# Patient Record
Sex: Male | Born: 1960 | Race: Black or African American | Hispanic: No | Smoking: Never smoker
Health system: Southern US, Community
[De-identification: ages and names within clinical notes are randomized; demographics above are authoritative.]

## PROBLEM LIST (undated history)

## (undated) DIAGNOSIS — H409 Unspecified glaucoma: Secondary | ICD-10-CM

## (undated) DIAGNOSIS — I219 Acute myocardial infarction, unspecified: Secondary | ICD-10-CM

## (undated) DIAGNOSIS — I2510 Atherosclerotic heart disease of native coronary artery without angina pectoris: Secondary | ICD-10-CM

## (undated) DIAGNOSIS — F329 Major depressive disorder, single episode, unspecified: Secondary | ICD-10-CM

## (undated) DIAGNOSIS — E119 Type 2 diabetes mellitus without complications: Secondary | ICD-10-CM

## (undated) DIAGNOSIS — I10 Essential (primary) hypertension: Secondary | ICD-10-CM

## (undated) DIAGNOSIS — F419 Anxiety disorder, unspecified: Secondary | ICD-10-CM

## (undated) DIAGNOSIS — F32A Depression, unspecified: Secondary | ICD-10-CM

## (undated) HISTORY — PX: OTHER SURGICAL HISTORY: SHX169

## (undated) HISTORY — PX: EYE SURGERY: SHX253

---

## 2016-11-01 ENCOUNTER — Inpatient Hospital Stay (HOSPITAL_COMMUNITY)
Admission: EM | Admit: 2016-11-01 | Discharge: 2016-11-03 | DRG: 247 | Disposition: A | Discharge status: Home / Self Care | Payer: Medicare Other | Attending: Cardiology | Admitting: Cardiology

## 2016-11-01 ENCOUNTER — Encounter (HOSPITAL_COMMUNITY): Payer: Self-pay

## 2016-11-01 ENCOUNTER — Emergency Department (HOSPITAL_COMMUNITY): Payer: Medicare Other

## 2016-11-01 DIAGNOSIS — Z6841 Body Mass Index (BMI) 40.0 and over, adult: Secondary | ICD-10-CM

## 2016-11-01 DIAGNOSIS — Z79899 Other long term (current) drug therapy: Secondary | ICD-10-CM | POA: Diagnosis not present

## 2016-11-01 DIAGNOSIS — G4733 Obstructive sleep apnea (adult) (pediatric): Secondary | ICD-10-CM | POA: Diagnosis present

## 2016-11-01 DIAGNOSIS — I214 Non-ST elevation (NSTEMI) myocardial infarction: Secondary | ICD-10-CM | POA: Diagnosis not present

## 2016-11-01 DIAGNOSIS — F329 Major depressive disorder, single episode, unspecified: Secondary | ICD-10-CM | POA: Diagnosis present

## 2016-11-01 DIAGNOSIS — Z7982 Long term (current) use of aspirin: Secondary | ICD-10-CM | POA: Diagnosis not present

## 2016-11-01 DIAGNOSIS — Z955 Presence of coronary angioplasty implant and graft: Secondary | ICD-10-CM

## 2016-11-01 DIAGNOSIS — I10 Essential (primary) hypertension: Secondary | ICD-10-CM | POA: Diagnosis not present

## 2016-11-01 DIAGNOSIS — I252 Old myocardial infarction: Secondary | ICD-10-CM

## 2016-11-01 DIAGNOSIS — Z7984 Long term (current) use of oral hypoglycemic drugs: Secondary | ICD-10-CM | POA: Diagnosis not present

## 2016-11-01 DIAGNOSIS — E1165 Type 2 diabetes mellitus with hyperglycemia: Secondary | ICD-10-CM | POA: Diagnosis present

## 2016-11-01 DIAGNOSIS — Z7902 Long term (current) use of antithrombotics/antiplatelets: Secondary | ICD-10-CM

## 2016-11-01 DIAGNOSIS — Z880 Allergy status to penicillin: Secondary | ICD-10-CM

## 2016-11-01 DIAGNOSIS — E785 Hyperlipidemia, unspecified: Secondary | ICD-10-CM | POA: Diagnosis not present

## 2016-11-01 DIAGNOSIS — F419 Anxiety disorder, unspecified: Secondary | ICD-10-CM | POA: Diagnosis present

## 2016-11-01 DIAGNOSIS — I219 Acute myocardial infarction, unspecified: Secondary | ICD-10-CM | POA: Diagnosis not present

## 2016-11-01 DIAGNOSIS — I2510 Atherosclerotic heart disease of native coronary artery without angina pectoris: Secondary | ICD-10-CM | POA: Diagnosis present

## 2016-11-01 DIAGNOSIS — E118 Type 2 diabetes mellitus with unspecified complications: Secondary | ICD-10-CM

## 2016-11-01 HISTORY — DX: Essential (primary) hypertension: I10

## 2016-11-01 HISTORY — DX: Major depressive disorder, single episode, unspecified: F32.9

## 2016-11-01 HISTORY — DX: Unspecified glaucoma: H40.9

## 2016-11-01 HISTORY — DX: Atherosclerotic heart disease of native coronary artery without angina pectoris: I25.10

## 2016-11-01 HISTORY — DX: Type 2 diabetes mellitus without complications: E11.9

## 2016-11-01 HISTORY — DX: Depression, unspecified: F32.A

## 2016-11-01 HISTORY — DX: Acute myocardial infarction, unspecified: I21.9

## 2016-11-01 HISTORY — DX: Anxiety disorder, unspecified: F41.9

## 2016-11-01 LAB — PROTIME-INR
INR: 1
Prothrombin Time: 13.2 seconds (ref 11.4–15.2)

## 2016-11-01 LAB — BASIC METABOLIC PANEL
Anion gap: 6 (ref 5–15)
BUN: 15 mg/dL (ref 6–20)
CO2: 25 mmol/L (ref 22–32)
Calcium: 9.1 mg/dL (ref 8.9–10.3)
Chloride: 107 mmol/L (ref 101–111)
Creatinine, Ser: 1.05 mg/dL (ref 0.61–1.24)
GFR calc Af Amer: >60 mL/min (ref >60)
GLUCOSE: 165 mg/dL — AB (ref 65–99)
POTASSIUM: 3.9 mmol/L (ref 3.5–5.1)
Sodium: 138 mmol/L (ref 135–145)

## 2016-11-01 LAB — I-STAT TROPONIN, ED: Troponin i, poc: 0.36 ng/mL (ref 0.00–0.08)

## 2016-11-01 LAB — CBC
HEMATOCRIT: 37.7 % — AB (ref 39.0–52.0)
HEMOGLOBIN: 12.7 g/dL — AB (ref 13.0–17.0)
MCH: 26.5 pg (ref 26.0–34.0)
MCHC: 33.7 g/dL (ref 30.0–36.0)
MCV: 78.7 fL (ref 78.0–100.0)
Platelets: 216 10*3/uL (ref 150–400)
RBC: 4.79 MIL/uL (ref 4.22–5.81)
RDW: 15.6 % — ABNORMAL HIGH (ref 11.5–15.5)
WBC: 6.3 10*3/uL (ref 4.0–10.5)

## 2016-11-01 LAB — APTT
APTT: 25 s (ref 24–36)
aPTT: 37 seconds — ABNORMAL HIGH (ref 24–36)

## 2016-11-01 MED ORDER — HEPARIN BOLUS VIA INFUSION
4000.0000 [IU] | Freq: Once | INTRAVENOUS | Status: AC
  Administered 2016-11-01: 4000 [IU] via INTRAVENOUS
  Filled 2016-11-01: qty 4000

## 2016-11-01 MED ORDER — ASPIRIN 81 MG PO CHEW: 81.0000 mg | CHEWABLE_TABLET | Freq: Every day | ORAL | Status: DC

## 2016-11-01 MED ORDER — CLOPIDOGREL BISULFATE 75 MG PO TABS
75.0000 mg | ORAL_TABLET | Freq: Once | ORAL | Status: DC
  Filled 2016-11-01: qty 1

## 2016-11-01 MED ORDER — HEPARIN (PORCINE) IN NACL 100-0.45 UNIT/ML-% IJ SOLN
1500.0000 [IU]/h | INTRAMUSCULAR | Status: DC
  Administered 2016-11-01: 1300 [IU]/h via INTRAVENOUS
  Administered 2016-11-02: 1500 [IU]/h via INTRAVENOUS
  Filled 2016-11-01 (×2): qty 250

## 2016-11-01 MED ORDER — SENNOSIDES-DOCUSATE SODIUM 8.6-50 MG PO TABS
1.0000 | ORAL_TABLET | Freq: Two times a day (BID) | ORAL | Status: DC
  Administered 2016-11-01 – 2016-11-02 (×3): 1 via ORAL
  Filled 2016-11-01 (×3): qty 1

## 2016-11-01 MED ORDER — ASPIRIN 81 MG PO CHEW
324.0000 mg | CHEWABLE_TABLET | ORAL | Status: AC
  Administered 2016-11-01: 324 mg via ORAL
  Filled 2016-11-01: qty 4

## 2016-11-01 MED ORDER — ONDANSETRON HCL 4 MG/2ML IJ SOLN
4.0000 mg | Freq: Four times a day (QID) | INTRAMUSCULAR | Status: DC | PRN
  Administered 2016-11-02: 4 mg via INTRAVENOUS
  Filled 2016-11-01: qty 2

## 2016-11-01 MED ORDER — NITROGLYCERIN IN D5W 200-5 MCG/ML-% IV SOLN
10.0000 ug/min | INTRAVENOUS | Status: DC
  Administered 2016-11-01: 10 ug/min via INTRAVENOUS
  Administered 2016-11-02: 23.333 ug/min via INTRAVENOUS
  Administered 2016-11-02: 50 ug/min via INTRAVENOUS
  Filled 2016-11-01 (×2): qty 250

## 2016-11-01 MED ORDER — PANTOPRAZOLE SODIUM 40 MG PO TBEC
40.0000 mg | DELAYED_RELEASE_TABLET | Freq: Two times a day (BID) | ORAL | Status: DC
  Administered 2016-11-01 – 2016-11-03 (×4): 40 mg via ORAL
  Filled 2016-11-01 (×4): qty 1

## 2016-11-01 MED ORDER — ASPIRIN EC 81 MG PO TBEC
81.0000 mg | DELAYED_RELEASE_TABLET | Freq: Every day | ORAL | Status: DC
  Administered 2016-11-02 – 2016-11-03 (×2): 81 mg via ORAL
  Filled 2016-11-01 (×2): qty 1

## 2016-11-01 MED ORDER — FUROSEMIDE 40 MG PO TABS
40.0000 mg | ORAL_TABLET | Freq: Two times a day (BID) | ORAL | Status: DC
  Administered 2016-11-02 – 2016-11-03 (×2): 40 mg via ORAL
  Filled 2016-11-01 (×2): qty 1

## 2016-11-01 MED ORDER — CARVEDILOL 12.5 MG PO TABS
12.5000 mg | ORAL_TABLET | Freq: Two times a day (BID) | ORAL | Status: DC
  Administered 2016-11-01 – 2016-11-03 (×3): 12.5 mg via ORAL
  Filled 2016-11-01 (×3): qty 1

## 2016-11-01 MED ORDER — CLOPIDOGREL BISULFATE 75 MG PO TABS
75.0000 mg | ORAL_TABLET | Freq: Every day | ORAL | Status: DC
  Administered 2016-11-02: 75 mg via ORAL
  Filled 2016-11-01: qty 1

## 2016-11-01 MED ORDER — ATORVASTATIN CALCIUM 80 MG PO TABS
80.0000 mg | ORAL_TABLET | Freq: Every day | ORAL | Status: DC
  Administered 2016-11-01 – 2016-11-02 (×2): 80 mg via ORAL
  Filled 2016-11-01 (×2): qty 1

## 2016-11-01 MED ORDER — LISINOPRIL 20 MG PO TABS
20.0000 mg | ORAL_TABLET | Freq: Two times a day (BID) | ORAL | Status: DC
  Administered 2016-11-01 – 2016-11-03 (×4): 20 mg via ORAL
  Filled 2016-11-01 (×4): qty 1

## 2016-11-01 MED ORDER — TRAMADOL HCL 50 MG PO TABS
50.0000 mg | ORAL_TABLET | Freq: Three times a day (TID) | ORAL | Status: DC | PRN
  Administered 2016-11-01 – 2016-11-02 (×2): 50 mg via ORAL
  Filled 2016-11-01 (×2): qty 1

## 2016-11-01 MED ORDER — ACETAMINOPHEN 325 MG PO TABS
650.0000 mg | ORAL_TABLET | ORAL | Status: DC | PRN
  Administered 2016-11-02 – 2016-11-03 (×2): 650 mg via ORAL
  Filled 2016-11-01 (×2): qty 2

## 2016-11-01 MED ORDER — AMLODIPINE BESYLATE 2.5 MG PO TABS
2.5000 mg | ORAL_TABLET | Freq: Two times a day (BID) | ORAL | Status: DC
  Administered 2016-11-01 – 2016-11-03 (×4): 2.5 mg via ORAL
  Filled 2016-11-01 (×4): qty 1

## 2016-11-01 MED ORDER — NITROGLYCERIN 0.4 MG SL SUBL: 0.4000 mg | SUBLINGUAL_TABLET | SUBLINGUAL | Status: DC | PRN

## 2016-11-01 MED ORDER — MAGNESIUM OXIDE 420 MG PO TABS: 840.0000 mg | ORAL_TABLET | Freq: Every day | ORAL | Status: DC

## 2016-11-01 MED ORDER — ASPIRIN 300 MG RE SUPP: 300.0000 mg | RECTAL | Status: AC

## 2016-11-01 MED ORDER — BISACODYL 5 MG PO TBEC
10.0000 mg | DELAYED_RELEASE_TABLET | Freq: Two times a day (BID) | ORAL | Status: DC | PRN
  Administered 2016-11-03: 10 mg via ORAL
  Filled 2016-11-01: qty 2

## 2016-11-01 MED ORDER — POTASSIUM CHLORIDE CRYS ER 10 MEQ PO TBCR
10.0000 meq | EXTENDED_RELEASE_TABLET | Freq: Every day | ORAL | Status: DC
  Administered 2016-11-02 – 2016-11-03 (×2): 10 meq via ORAL
  Filled 2016-11-01 (×2): qty 1

## 2016-11-01 NOTE — Progress Notes (Signed)
ANTICOAGULATION CONSULT NOTE - Initial Consult  Pharmacy Consult for IV heparin Indication: chest pain/ACS  Allergies  Allergen Reactions  . Penicillins Hives and Other (See Comments)    Has patient had a PCN reaction causing immediate rash, facial/tongue/throat swelling, SOB or lightheadedness with hypotension: No Has patient had a PCN reaction causing severe rash involving mucus membranes or skin necrosis: No Has patient had a PCN reaction that required hospitalization No Has patient had a PCN reaction occurring within the last 10 years: No If all of the above answers are "NO", then may proceed with Cephalosporin use.    Patient Measurements: Height: 5' 9.5" (176.5 cm) Weight: 278 lb (126.1 kg) IBW/kg (Calculated) : 71.85 Heparin Dosing Weight: 100.7 kg  Vital Signs: Temp: 97.6 F (36.4 C) (02/12 1657) Temp Source: Oral (02/12 1657) BP: 143/82 (02/12 1930) Pulse Rate: 84 (02/12 1930)  Labs:  Recent Labs  11/01/16 1738 11/01/16 1739  HGB  --  12.7*  HCT  --  37.7*  PLT  --  216  APTT 25  --   LABPROT 13.2  --   INR 1.00  --   CREATININE  --  1.05    Estimated Creatinine Clearance: 107.7 mL/min (by C-G formula based on SCr of 1.05 mg/dL).   Medical History: Past Medical History:  Diagnosis Date  . Anxiety   . Coronary artery disease   . Depression   . Diabetes mellitus without complication (HCC)   . Glaucoma   . Hypertension   . Myocardial infarct      Assessment: 42 y/oM with PMH of CAD, DM, HTN, MI, anxiety, depression who presents to [facility] ED on 11/01/2016 with left upper arm and chest pain x 2 days. Troponin elevated in ED. Pharmacy consulted to start IV heparin infusion. Patient on DAPT (ASA/Plavix) PTA, but not on any anticoagulants PTA. Baseline aPTT, PT/INR WNL.  Goal of Therapy:  Heparin level 0.3-0.7 units/ml Monitor platelets by anticoagulation protocol: Yes   Plan:   Heparin 4000 units IV bolus, then start heparin infusion at 1300  units/hr.  Heparin level 6 hours after start of infusion.  Daily CBC and heparin level while on heparin infusion.  Monitor closely for s/sx of bleeding.   Greer Pickerel, PharmD, BCPS Pager: [phone] 11/01/2016 7:47 PM

## 2016-11-01 NOTE — H&P (Signed)
CARDIOLOGY H&P  HPI: 3M w/ history of CAD (s/p multiple MI's and multiple stents), HTN, HLD, DM p/w NSTEMI.   Patient visiting from out of town [facility]) due to a death in his family (his youngest daughter passed away). Erasmo Score was yesterday. Yesterday afternoon began experiencing left sided chest pain and pressure radiating into the neck and the left arm. Same pain that he has experienced in the past during his multiple prior MI's. Pain came on at rest and stuttered on and off for the remainder of the day and overnight. Patient's other daughter brought him to the hospital today for evaluation. Patient has now been chest pain free for >6 hours.   Patient notes that he has no history of volume retention or heart failure. He recalls having an echo sometime in the past few months with an LVEF of 60%. He had a coronary angiography in January 2018 (via radial approach) and does not believe any new stents were placed at that time. He takes aspirin and plavix every day. He denies any illicit drug use or alcohol use. He does not smoke cigarettes.   At OSH, patient was found to have an elevated troponin to 0.3. He was started on a nitro gtt and heparin gtt and transferred to [facility] for consideration of angiography.   Review of Systems:     Cardiac Review of Systems: {Y] = yes [ ]  = no  Chest Pain [   ]  Resting SOB [   ] Exertional SOB  [  ]  Orthopnea [  ]   Pedal Edema [   ]    Palpitations [  ] Syncope  [  ]   Presyncope [   ]  General Review of Systems: [Y] = yes [  ]=no Constitional: recent weight change [  ]; anorexia [  ]; fatigue [  ]; nausea [  ]; night sweats [  ]; fever [  ]; or chills [  ];                                                                     Dental: poor dentition[  ];   Eye : blurred vision [  ]; diplopia [   ]; vision changes [  ];  Amaurosis fugax[  ]; Resp: cough [  ];  wheezing[  ];  hemoptysis[  ]; shortness of breath[  ]; paroxysmal nocturnal dyspnea[  ]; dyspnea on  exertion[  ]; or orthopnea[  ];  GI:  gallstones[  ], vomiting[  ];  dysphagia[  ]; melena[  ];  hematochezia [  ]; heartburn[  ];   GU: kidney stones [  ]; hematuria[  ];   dysuria [  ];  nocturia[  ];               Skin: rash [  ], swelling[  ];, hair loss[  ];  peripheral edema[  ];  or itching[  ]; Musculosketetal: myalgias[  ];  joint swelling[  ];  joint erythema[  ];  joint pain[  ];  back pain[  ];  Heme/Lymph: bruising[  ];  bleeding[  ];  anemia[  ];  Neuro: TIA[  ];  headaches[  ];  stroke[  ];  vertigo[  ];  seizures[  ];   paresthesias[  ];  difficulty walking[  ];  Psych:depression[  ]; anxiety[  ];  Endocrine: diabetes[  ];  thyroid dysfunction[  ];  Other:  Past Medical History:  Diagnosis Date  . Anxiety   . Coronary artery disease   . Depression   . Diabetes mellitus without complication (HCC)   . Glaucoma   . Hypertension   . Myocardial infarct     Prior to Admission medications   Medication Sig Start Date End Date Taking? Authorizing Provider  amLODipine (NORVASC) 5 MG tablet Take 2.5 mg by mouth 2 (two) times daily.   Yes Historical Provider, MD  aspirin 81 MG chewable tablet Chew 81 mg by mouth daily.   Yes Historical Provider, MD  atorvastatin (LIPITOR) 80 MG tablet Take 80 mg by mouth at bedtime.   Yes Historical Provider, MD  bisacodyl (DULCOLAX) 5 MG EC tablet Take 10 mg by mouth 2 (two) times daily as needed for moderate constipation.   Yes Historical Provider, MD  carvedilol (COREG) 25 MG tablet Take 12.5 mg by mouth 2 (two) times daily with a meal.   Yes Historical Provider, MD  clopidogrel (PLAVIX) 75 MG tablet Take 75 mg by mouth daily.   Yes Historical Provider, MD  furosemide (LASIX) 40 MG tablet Take 40 mg by mouth 2 (two) times daily.   Yes Historical Provider, MD  lisinopril (PRINIVIL,ZESTRIL) 20 MG tablet Take 20 mg by mouth 2 (two) times daily.   Yes Historical Provider, MD  Magnesium Oxide 420 MG TABS Take 840 mg by mouth daily.   Yes Historical  Provider, MD  metFORMIN (GLUCOPHAGE-XR) 500 MG 24 hr tablet Take 500 mg by mouth daily with breakfast.   Yes Historical Provider, MD  pantoprazole (PROTONIX) 40 MG tablet Take 40 mg by mouth 2 (two) times daily.   Yes Historical Provider, MD  potassium chloride (K-DUR,KLOR-CON) 10 MEQ tablet Take 10 mEq by mouth daily.   Yes Historical Provider, MD  senna-docusate (SENOKOT-S) 8.6-50 MG tablet Take 1 tablet by mouth 2 (two) times daily.   Yes Historical Provider, MD  traMADol (ULTRAM) 50 MG tablet Take 50 mg by mouth 3 (three) times daily as needed for moderate pain.   Yes Historical Provider, MD    Allergies  Allergen Reactions  . Penicillins Hives and Other (See Comments)    Has patient had a PCN reaction causing immediate rash, facial/tongue/throat swelling, SOB or lightheadedness with hypotension: No Has patient had a PCN reaction causing severe rash involving mucus membranes or skin necrosis: No Has patient had a PCN reaction that required hospitalization No Has patient had a PCN reaction occurring within the last 10 years: No If all of the above answers are "NO", then may proceed with Cephalosporin use.    Social History   Social History  . Marital status: Divorced    Spouse name: N/A  . Number of children: N/A  . Years of education: N/A   Occupational History  . Not on file.   Social History Main Topics  . Smoking status: Never Smoker  . Smokeless tobacco: Never Used  . Alcohol use No  . Drug use: No  . Sexual activity: Not on file   Other Topics Concern  . Not on file   Social History Narrative  . No narrative on file    History reviewed. No pertinent family history. No family history of premature CAD or sudden cardiac death.   PHYSICAL  EXAM: Vitals:   11/01/16 1939 11/01/16 2030  BP: (!) 165/101   Pulse: 84   Resp:    Temp:  97.4 F (36.3 C)   General:  Well appearing. Obese HEENT: normal Neck: supple. Obese. no JVD. Carotids 2+ bilat; no bruits.  Cor:  PMI nondisplaced. Regular rate & rhythm. No rubs, gallops or murmurs. Distant heart sounds.  Lungs: clear Abdomen: soft, nontender, nondistended. No hepatosplenomegaly. No bruits or masses. Good bowel sounds. Extremities: no cyanosis, clubbing, rash, edema Neuro: alert & oriented x 3, cranial nerves grossly intact. moves all 4 extremities w/o difficulty. Depressed mood.  ECG: NSR, HR 93, left axis deviation, LVH, nonspecific T wave flattening in the lateral precordium, no Q waves or ST elevations  Results for orders placed or performed during the hospital encounter of 11/01/16 (from the past 24 hour(s))  Protime-INR     Status: None   Collection Time: 11/01/16  5:38 PM  Result Value Ref Range   Prothrombin Time 13.2 11.4 - 15.2 seconds   INR 1.00   APTT     Status: None   Collection Time: 11/01/16  5:38 PM  Result Value Ref Range   aPTT 25 24 - 36 seconds  Basic metabolic panel     Status: Abnormal   Collection Time: 11/01/16  5:39 PM  Result Value Ref Range   Sodium 138 135 - 145 mmol/L   Potassium 3.9 3.5 - 5.1 mmol/L   Chloride 107 101 - 111 mmol/L   CO2 25 22 - 32 mmol/L   Glucose, Bld 165 (H) 65 - 99 mg/dL   BUN 15 6 - 20 mg/dL   Creatinine, Ser 1.61 0.61 - 1.24 mg/dL   Calcium 9.1 8.9 - 09.6 mg/dL   GFR calc non Af Amer >60 >60 mL/min   GFR calc Af Amer >60 >60 mL/min   Anion gap 6 5 - 15  CBC     Status: Abnormal   Collection Time: 11/01/16  5:39 PM  Result Value Ref Range   WBC 6.3 4.0 - 10.5 K/uL   RBC 4.79 4.22 - 5.81 MIL/uL   Hemoglobin 12.7 (L) 13.0 - 17.0 g/dL   HCT 04.5 (L) 40.9 - 81.1 %   MCV 78.7 78.0 - 100.0 fL   MCH 26.5 26.0 - 34.0 pg   MCHC 33.7 30.0 - 36.0 g/dL   RDW 91.4 (H) 78.2 - 95.6 %   Platelets 216 150 - 400 K/uL  I-stat troponin, ED     Status: Abnormal   Collection Time: 11/01/16  5:50 PM  Result Value Ref Range   Troponin i, poc 0.36 (HH) 0.00 - 0.08 ng/mL   Comment NOTIFIED PHYSICIAN    Comment 3           Dg Chest 2 View  Result  Date: 11/01/2016 CLINICAL DATA:  Shortness of breath and cough for 2 weeks, diagnosed with the flu over a month ago, hypertension, diabetes mellitus, coronary artery disease post MI EXAM: CHEST  2 VIEW COMPARISON:  None FINDINGS: Upper normal heart size. Coronary stent noted. Mediastinal contours and pulmonary vascularity normal. Minimal scarring versus subsegmental atelectasis in lingula. Lungs otherwise clear. No pleural effusion or pneumothorax. Endplate spur formation thoracic spine. IMPRESSION: Post coronary stenting. Minimal atelectasis versus scarring in lingula without acute infiltrate. Electronically Signed   By: Ulyses Southward M.D.   On: 11/01/2016 17:44    ASSESSMENT: 46M w/ history of CAD (s/p multiple MI's and multiple stents), HTN, HLD, DM p/w  NSTEMI in setting of emotional distress. Mostly likely 2/2 coronary event given known multivessel disease and multiple prior MI's. Association with daughter's funeral also raises possibility of stress induced cardiomyopathy, however no signs of heart failure on exam.   PLAN/DISCUSSION: - NPOpMN for possible cath in AM - continue ASA, plavix (patient took his plavix on the morning of presentation) - cont home ACE, statin, coreg, lasix - start ISS - cycle CBM's - heparin, nitro gtt's - echo tomorrow - Full code

## 2016-11-01 NOTE — ED Notes (Signed)
Report given to 4N.

## 2016-11-01 NOTE — ED Triage Notes (Signed)
Pt with left upper arm and upper chest pain x 2 days.  No shortness of breath.  Pain goes up into neck.

## 2016-11-01 NOTE — ED Notes (Addendum)
Per Dr. Ethelda Chick, increase nitroglycerin 10 mcg/min every 5 minutes if chest pain persists and blood pressure tolerates. Do not increase if blood pressure is less than 110 systolic.

## 2016-11-01 NOTE — ED Provider Notes (Signed)
[facility]-EMERGENCY DEPT Provider Note   CSN: [id number] Arrival date & time: 11/01/16  1631     History   Chief Complaint Chief Complaint  Patient presents with  . Arm Pain  . Chest Pain    HPI Lance Baker is a 56 y.o. male.Patient developed left anterior chest pain waxes and wanes onset approximately 24 hours ago. Pain onset while at rest. Radiates to left arm. Feels like "heart pain" he's had in the past. He treated himself with a full dose aspirin today. Did not take Plavix today. Did not take nitroglycerin as he does not have any with him. Associated symptoms and include cold sweats and intermittent shortness of breath though not short of breath presently. Presently pain is 7 on a scale of 1-10. No other associated symptoms.  HPI  Past Medical History:  Diagnosis Date  . Anxiety   . Coronary artery disease   . Depression   . Diabetes mellitus without complication (HCC)   . Glaucoma   . Hypertension   . Myocardial infarct     Patient Active Problem List   Diagnosis Date Noted  . NSTEMI (non-ST elevated myocardial infarction) (HCC) 11/01/2016    Past Surgical History:  Procedure Laterality Date  . cardiac stents    . EYE SURGERY         Home Medications    Prior to Admission medications   Medication Sig Start Date End Date Taking? Authorizing Provider  amLODipine (NORVASC) 5 MG tablet Take 2.5 mg by mouth 2 (two) times daily.   Yes Historical Provider, MD  aspirin 81 MG chewable tablet Chew 81 mg by mouth daily.   Yes Historical Provider, MD  atorvastatin (LIPITOR) 80 MG tablet Take 80 mg by mouth at bedtime.   Yes Historical Provider, MD  bisacodyl (DULCOLAX) 5 MG EC tablet Take 10 mg by mouth 2 (two) times daily as needed for moderate constipation.   Yes Historical Provider, MD  carvedilol (COREG) 25 MG tablet Take 12.5 mg by mouth 2 (two) times daily with a meal.   Yes Historical Provider, MD  clopidogrel (PLAVIX) 75 MG tablet Take 75 mg by mouth daily.    Yes Historical Provider, MD  furosemide (LASIX) 40 MG tablet Take 40 mg by mouth 2 (two) times daily.   Yes Historical Provider, MD  lisinopril (PRINIVIL,ZESTRIL) 20 MG tablet Take 20 mg by mouth 2 (two) times daily.   Yes Historical Provider, MD  Magnesium Oxide 420 MG TABS Take 840 mg by mouth daily.   Yes Historical Provider, MD  metFORMIN (GLUCOPHAGE-XR) 500 MG 24 hr tablet Take 500 mg by mouth daily with breakfast.   Yes Historical Provider, MD  pantoprazole (PROTONIX) 40 MG tablet Take 40 mg by mouth 2 (two) times daily.   Yes Historical Provider, MD  potassium chloride (K-DUR,KLOR-CON) 10 MEQ tablet Take 10 mEq by mouth daily.   Yes Historical Provider, MD  senna-docusate (SENOKOT-S) 8.6-50 MG tablet Take 1 tablet by mouth 2 (two) times daily.   Yes Historical Provider, MD  traMADol (ULTRAM) 50 MG tablet Take 50 mg by mouth 3 (three) times daily as needed for moderate pain.   Yes Historical Provider, MD    Family History History reviewed. No pertinent family history.  Social History Social History  Substance Use Topics  . Smoking status: Never Smoker  . Smokeless tobacco: Never Used  . Alcohol use No     Allergies   Penicillins   Review of Systems Review of Systems  Constitutional: Positive for diaphoresis.  HENT: Negative.   Respiratory: Positive for shortness of breath.   Cardiovascular: Positive for chest pain.  Gastrointestinal: Negative.   Musculoskeletal: Negative.   Skin: Negative.   Neurological: Negative.   Psychiatric/Behavioral: Negative.   All other systems reviewed and are negative.    Physical Exam Updated Vital Signs BP 136/99   Pulse 88   Temp 97.6 F (36.4 C) (Oral)   Resp 14   Ht 5' 9.5" (1.765 m)   Wt 278 lb (126.1 kg)   SpO2 98%   BMI 40.46 kg/m   Physical Exam  Constitutional: He appears well-developed and well-nourished. No distress.  HENT:  Head: Normocephalic and atraumatic.  Eyes: Conjunctivae are normal. Pupils are equal,  round, and reactive to light.  Neck: Neck supple. No tracheal deviation present. No thyromegaly present.  Cardiovascular: Normal rate and regular rhythm.   No murmur heard. Pulmonary/Chest: Effort normal and breath sounds normal.  Abdominal: Soft. Bowel sounds are normal. He exhibits no distension. There is no tenderness.  Obese  Musculoskeletal: Normal range of motion. He exhibits no edema or tenderness.  Neurological: He is alert. Coordination normal.  Skin: Skin is warm and dry. No rash noted.  Psychiatric: He has a normal mood and affect.  Nursing note and vitals reviewed.    ED Treatments / Results  Labs (all labs ordered are listed, but only abnormal results are displayed) Labs Reviewed  BASIC METABOLIC PANEL - Abnormal; Notable for the following:       Result Value   Glucose, Bld 165 (*)    All other components within normal limits  CBC - Abnormal; Notable for the following:    Hemoglobin 12.7 (*)    HCT 37.7 (*)    RDW 15.6 (*)    All other components within normal limits  I-STAT TROPOININ, ED - Abnormal; Notable for the following:    Troponin i, poc 0.36 (*)    All other components within normal limits  PROTIME-INR  APTT    EKG  EKG Interpretation  Date/Time:  Monday November 01 2016 16:52:41 EST Ventricular Rate:  93 PR Interval:    QRS Duration: 99 QT Interval:  357 QTC Calculation: 444 R Axis:   -35 Text Interpretation:  Sinus rhythm Probable left atrial enlargement Left ventricular hypertrophy Abnormal T, consider ischemia, lateral leads Baseline wander in lead(s) V1 V2 V4 V5 V6 No old tracing to compare Confirmed by Ethelda Chick  MD, Anber Mckiver [phone]) on 11/01/2016 6:36:25 PM       Radiology Dg Chest 2 View  Result Date: 11/01/2016 CLINICAL DATA:  Shortness of breath and cough for 2 weeks, diagnosed with the flu over a month ago, hypertension, diabetes mellitus, coronary artery disease post MI EXAM: CHEST  2 VIEW COMPARISON:  None FINDINGS: Upper normal heart  size. Coronary stent noted. Mediastinal contours and pulmonary vascularity normal. Minimal scarring versus subsegmental atelectasis in lingula. Lungs otherwise clear. No pleural effusion or pneumothorax. Endplate spur formation thoracic spine. IMPRESSION: Post coronary stenting. Minimal atelectasis versus scarring in lingula without acute infiltrate. Electronically Signed   By: Ulyses Southward M.D.   On: 11/01/2016 17:44    Procedures Procedures (including critical care time)  Medications Ordered in ED Medications  clopidogrel (PLAVIX) tablet 75 mg (not administered)  nitroGLYCERIN 50 mg in dextrose 5 % 250 mL (0.2 mg/mL) infusion (30 mcg/min Intravenous Rate/Dose Change 11/01/16 1909)  heparin bolus via infusion 4,000 Units (not administered)    Followed by  heparin ADULT  infusion 100 units/mL (25000 units/21mL sodium chloride 0.45%) (not administered)     Initial Impression / Assessment and Plan / ED Course  I have reviewed the triage vital signs and the nursing notes.  Pertinent labs & imaging results that were available during my care of the patient were reviewed by me and considered in my medical decision making (see chart for details).     EKG, troponin, history consistent withNSTEMI.. Dr.Macalhany consulted. Intravenous heparin and intravenous nitroglycerin drips ordered. Nitroglycerin drip to titrate to pain or to systolic blood pressure 110. Dr.Macalhany requests transfer to [facility] Stepdown unit which patient agrees with Chest x-ray viewed by me Results for orders placed or performed during the hospital encounter of 11/01/16  Basic metabolic panel  Result Value Ref Range   Sodium 138 135 - 145 mmol/L   Potassium 3.9 3.5 - 5.1 mmol/L   Chloride 107 101 - 111 mmol/L   CO2 25 22 - 32 mmol/L   Glucose, Bld 165 (H) 65 - 99 mg/dL   BUN 15 6 - 20 mg/dL   Creatinine, Ser 1.61 0.61 - 1.24 mg/dL   Calcium 9.1 8.9 - 09.6 mg/dL   GFR calc non Af Amer >60 >60 mL/min   GFR  calc Af Amer >60 >60 mL/min   Anion gap 6 5 - 15  CBC  Result Value Ref Range   WBC 6.3 4.0 - 10.5 K/uL   RBC 4.79 4.22 - 5.81 MIL/uL   Hemoglobin 12.7 (L) 13.0 - 17.0 g/dL   HCT 04.5 (L) 40.9 - 81.1 %   MCV 78.7 78.0 - 100.0 fL   MCH 26.5 26.0 - 34.0 pg   MCHC 33.7 30.0 - 36.0 g/dL   RDW 91.4 (H) 78.2 - 95.6 %   Platelets 216 150 - 400 K/uL  Protime-INR  Result Value Ref Range   Prothrombin Time 13.2 11.4 - 15.2 seconds   INR 1.00   APTT  Result Value Ref Range   aPTT 25 24 - 36 seconds  I-stat troponin, ED  Result Value Ref Range   Troponin i, poc 0.36 (HH) 0.00 - 0.08 ng/mL   Comment NOTIFIED PHYSICIAN    Comment 3           Dg Chest 2 View  Result Date: 11/01/2016 CLINICAL DATA:  Shortness of breath and cough for 2 weeks, diagnosed with the flu over a month ago, hypertension, diabetes mellitus, coronary artery disease post MI EXAM: CHEST  2 VIEW COMPARISON:  None FINDINGS: Upper normal heart size. Coronary stent noted. Mediastinal contours and pulmonary vascularity normal. Minimal scarring versus subsegmental atelectasis in lingula. Lungs otherwise clear. No pleural effusion or pneumothorax. Endplate spur formation thoracic spine. IMPRESSION: Post coronary stenting. Minimal atelectasis versus scarring in lingula without acute infiltrate. Electronically Signed   By: Ulyses Southward M.D.   On: 11/01/2016 17:44   Final Clinical Impressions(s) / ED Diagnoses  Dx #1 NSTEMI Final diagnoses:  None   #2hyperglycemia New Prescriptions New Prescriptions   No medications on file  CRITICAL CARE Performed by: Doug Sou Total critical care time: 30 minutes Critical care time was exclusive of separately billable procedures and treating other patients. Critical care was necessary to treat or prevent imminent or life-threatening deterioration. Critical care was time spent personally by me on the following activities: development of treatment plan with patient and/or surrogate as well  as nursing, discussions with consultants, evaluation of patient's response to treatment, examination of patient, obtaining history from patient or surrogate,  ordering and performing treatments and interventions, ordering and review of laboratory studies, ordering and review of radiographic studies, pulse oximetry and re-evaluation of patient's condition.   Doug Sou, MD 11/01/16 2044

## 2016-11-02 ENCOUNTER — Inpatient Hospital Stay (HOSPITAL_COMMUNITY): Payer: Medicare Other

## 2016-11-02 ENCOUNTER — Encounter (HOSPITAL_COMMUNITY)
Admission: EM | Disposition: A | Discharge status: Home / Self Care | Payer: Self-pay | Source: Home / Self Care | Attending: Cardiology

## 2016-11-02 DIAGNOSIS — I214 Non-ST elevation (NSTEMI) myocardial infarction: Principal | ICD-10-CM

## 2016-11-02 DIAGNOSIS — E785 Hyperlipidemia, unspecified: Secondary | ICD-10-CM

## 2016-11-02 DIAGNOSIS — E118 Type 2 diabetes mellitus with unspecified complications: Secondary | ICD-10-CM

## 2016-11-02 DIAGNOSIS — I10 Essential (primary) hypertension: Secondary | ICD-10-CM

## 2016-11-02 DIAGNOSIS — I219 Acute myocardial infarction, unspecified: Secondary | ICD-10-CM

## 2016-11-02 HISTORY — PX: LEFT HEART CATH AND CORONARY ANGIOGRAPHY: CATH118249

## 2016-11-02 HISTORY — PX: CORONARY STENT INTERVENTION: CATH118234

## 2016-11-02 LAB — BASIC METABOLIC PANEL
Anion gap: 6 (ref 5–15)
BUN: 11 mg/dL (ref 6–20)
CHLORIDE: 105 mmol/L (ref 101–111)
CO2: 27 mmol/L (ref 22–32)
Calcium: 8.6 mg/dL — ABNORMAL LOW (ref 8.9–10.3)
Creatinine, Ser: 1.02 mg/dL (ref 0.61–1.24)
GFR calc Af Amer: >60 mL/min (ref >60)
GLUCOSE: 114 mg/dL — AB (ref 65–99)
POTASSIUM: 3.5 mmol/L (ref 3.5–5.1)
Sodium: 138 mmol/L (ref 135–145)

## 2016-11-02 LAB — CBC
HCT: 33.1 % — ABNORMAL LOW (ref 39.0–52.0)
Hemoglobin: 10.7 g/dL — ABNORMAL LOW (ref 13.0–17.0)
MCH: 25.8 pg — ABNORMAL LOW (ref 26.0–34.0)
MCHC: 32.3 g/dL (ref 30.0–36.0)
MCV: 80 fL (ref 78.0–100.0)
PLATELETS: 189 10*3/uL (ref 150–400)
RBC: 4.14 MIL/uL — ABNORMAL LOW (ref 4.22–5.81)
RDW: 15.5 % (ref 11.5–15.5)
WBC: 7.3 10*3/uL (ref 4.0–10.5)

## 2016-11-02 LAB — HEPARIN LEVEL (UNFRACTIONATED)
HEPARIN UNFRACTIONATED: 0.23 [IU]/mL — AB (ref 0.30–0.70)
HEPARIN UNFRACTIONATED: 0.4 [IU]/mL (ref 0.30–0.70)

## 2016-11-02 LAB — MRSA PCR SCREENING: MRSA BY PCR: NEGATIVE

## 2016-11-02 LAB — TROPONIN I
TROPONIN I: 0.64 ng/mL — AB (ref <0.03)
Troponin I: 0.54 ng/mL (ref <0.03)
Troponin I: 0.74 ng/mL (ref <0.03)

## 2016-11-02 LAB — ECHOCARDIOGRAM COMPLETE
HEIGHTINCHES: 69.5 in
WEIGHTICAEL: 4528 [oz_av]

## 2016-11-02 SURGERY — LEFT HEART CATH AND CORONARY ANGIOGRAPHY
Anesthesia: LOCAL

## 2016-11-02 MED ORDER — HEPARIN (PORCINE) IN NACL 2-0.9 UNIT/ML-% IJ SOLN
INTRAMUSCULAR | Status: AC
  Filled 2016-11-02: qty 1000

## 2016-11-02 MED ORDER — IOPAMIDOL (ISOVUE-370) INJECTION 76%
INTRAVENOUS | Status: AC
  Filled 2016-11-02: qty 100

## 2016-11-02 MED ORDER — VERAPAMIL HCL 2.5 MG/ML IV SOLN
INTRAVENOUS | Status: DC | PRN
  Administered 2016-11-02: 19:00:00 via INTRA_ARTERIAL

## 2016-11-02 MED ORDER — TICAGRELOR 90 MG PO TABS
90.0000 mg | ORAL_TABLET | Freq: Two times a day (BID) | ORAL | Status: DC
  Administered 2016-11-03: 90 mg via ORAL
  Filled 2016-11-02: qty 1

## 2016-11-02 MED ORDER — HYDRALAZINE HCL 20 MG/ML IJ SOLN: 5.0000 mg | INTRAMUSCULAR | Status: DC | PRN

## 2016-11-02 MED ORDER — SODIUM CHLORIDE 0.9% FLUSH
3.0000 mL | Freq: Two times a day (BID) | INTRAVENOUS | Status: DC
  Administered 2016-11-03: 3 mL via INTRAVENOUS

## 2016-11-02 MED ORDER — SODIUM CHLORIDE 0.9 % WEIGHT BASED INFUSION
1.0000 mL/kg/h | INTRAVENOUS | Status: DC
  Administered 2016-11-02: 1 mL/kg/h via INTRAVENOUS

## 2016-11-02 MED ORDER — TICAGRELOR 90 MG PO TABS
ORAL_TABLET | ORAL | Status: AC
  Filled 2016-11-02: qty 2

## 2016-11-02 MED ORDER — MIDAZOLAM HCL 2 MG/2ML IJ SOLN
INTRAMUSCULAR | Status: AC
  Filled 2016-11-02: qty 2

## 2016-11-02 MED ORDER — NITROGLYCERIN 1 MG/10 ML FOR IR/CATH LAB
INTRA_ARTERIAL | Status: DC | PRN
  Administered 2016-11-02: 200 ug via INTRA_ARTERIAL
  Administered 2016-11-02: 200 ug via INTRACORONARY

## 2016-11-02 MED ORDER — FENTANYL CITRATE (PF) 100 MCG/2ML IJ SOLN
INTRAMUSCULAR | Status: DC | PRN
  Administered 2016-11-02 (×4): 25 ug via INTRAVENOUS

## 2016-11-02 MED ORDER — SODIUM CHLORIDE 0.9% FLUSH: 3.0000 mL | INTRAVENOUS | Status: DC | PRN

## 2016-11-02 MED ORDER — SODIUM CHLORIDE 0.9 % IV SOLN
INTRAVENOUS | Status: DC
Start: 2016-11-02 — End: 2016-11-03

## 2016-11-02 MED ORDER — HEPARIN (PORCINE) IN NACL 2-0.9 UNIT/ML-% IJ SOLN
INTRAMUSCULAR | Status: AC
  Filled 2016-11-02: qty 500

## 2016-11-02 MED ORDER — IOPAMIDOL (ISOVUE-370) INJECTION 76%
INTRAVENOUS | Status: AC
  Filled 2016-11-02: qty 125

## 2016-11-02 MED ORDER — BIVALIRUDIN 250 MG IV SOLR
INTRAVENOUS | Status: AC
  Filled 2016-11-02: qty 250

## 2016-11-02 MED ORDER — LABETALOL HCL 5 MG/ML IV SOLN
10.0000 mg | INTRAVENOUS | Status: DC | PRN
  Administered 2016-11-03 (×2): 10 mg via INTRAVENOUS
  Filled 2016-11-02 (×2): qty 4

## 2016-11-02 MED ORDER — NITROGLYCERIN 1 MG/10 ML FOR IR/CATH LAB
INTRA_ARTERIAL | Status: AC
  Filled 2016-11-02: qty 10

## 2016-11-02 MED ORDER — LIDOCAINE HCL (PF) 1 % IJ SOLN
INTRAMUSCULAR | Status: DC | PRN
  Administered 2016-11-02: 5 mL

## 2016-11-02 MED ORDER — LIDOCAINE HCL (PF) 1 % IJ SOLN
INTRAMUSCULAR | Status: AC
  Filled 2016-11-02: qty 30

## 2016-11-02 MED ORDER — SODIUM CHLORIDE 0.9 % IV SOLN: 250.0000 mL | INTRAVENOUS | Status: DC | PRN

## 2016-11-02 MED ORDER — SODIUM CHLORIDE 0.9% FLUSH
3.0000 mL | INTRAVENOUS | Status: DC | PRN
Start: 2016-11-02 — End: 2016-11-02

## 2016-11-02 MED ORDER — ASPIRIN 81 MG PO CHEW: 81.0000 mg | CHEWABLE_TABLET | Freq: Every day | ORAL | Status: DC

## 2016-11-02 MED ORDER — TICAGRELOR 90 MG PO TABS
ORAL_TABLET | ORAL | Status: DC | PRN
  Administered 2016-11-02: 180 mg via ORAL

## 2016-11-02 MED ORDER — SODIUM CHLORIDE 0.9 % IV SOLN
INTRAVENOUS | Status: DC | PRN
  Administered 2016-11-02: 1.75 mg/kg/h via INTRAVENOUS
  Administered 2016-11-02 (×2)

## 2016-11-02 MED ORDER — SODIUM CHLORIDE 0.9 % WEIGHT BASED INFUSION: 3.0000 mL/kg/h | INTRAVENOUS | Status: DC

## 2016-11-02 MED ORDER — MIDAZOLAM HCL 2 MG/2ML IJ SOLN
INTRAMUSCULAR | Status: DC | PRN
  Administered 2016-11-02 (×3): 1 mg via INTRAVENOUS
  Administered 2016-11-02: 2 mg via INTRAVENOUS

## 2016-11-02 MED ORDER — DIAZEPAM 5 MG PO TABS
5.0000 mg | ORAL_TABLET | Freq: Four times a day (QID) | ORAL | Status: DC | PRN
  Administered 2016-11-03: 5 mg via ORAL
  Filled 2016-11-02: qty 1

## 2016-11-02 MED ORDER — IOPAMIDOL (ISOVUE-370) INJECTION 76%
INTRAVENOUS | Status: DC | PRN
  Administered 2016-11-02: 325 mL via INTRA_ARTERIAL

## 2016-11-02 MED ORDER — HEPARIN (PORCINE) IN NACL 2-0.9 UNIT/ML-% IJ SOLN
INTRAMUSCULAR | Status: DC | PRN
  Administered 2016-11-02: 1000 mL via INTRA_ARTERIAL

## 2016-11-02 MED ORDER — VERAPAMIL HCL 2.5 MG/ML IV SOLN
INTRAVENOUS | Status: AC
  Filled 2016-11-02: qty 2

## 2016-11-02 MED ORDER — HEPARIN SODIUM (PORCINE) 1000 UNIT/ML IJ SOLN
INTRAMUSCULAR | Status: AC
  Filled 2016-11-02: qty 1

## 2016-11-02 MED ORDER — ATORVASTATIN CALCIUM 80 MG PO TABS: 80.0000 mg | ORAL_TABLET | Freq: Every day | ORAL | Status: DC

## 2016-11-02 MED ORDER — IOPAMIDOL (ISOVUE-370) INJECTION 76%
INTRAVENOUS | Status: AC
  Filled 2016-11-02: qty 50

## 2016-11-02 MED ORDER — FENTANYL CITRATE (PF) 100 MCG/2ML IJ SOLN
INTRAMUSCULAR | Status: AC
  Filled 2016-11-02: qty 2

## 2016-11-02 MED ORDER — SODIUM CHLORIDE 0.9% FLUSH: 3.0000 mL | Freq: Two times a day (BID) | INTRAVENOUS | Status: DC

## 2016-11-02 MED ORDER — TICAGRELOR 90 MG PO TABS
ORAL_TABLET | ORAL | Status: AC
  Filled 2016-11-02: qty 1

## 2016-11-02 MED ORDER — NITROGLYCERIN IN D5W 200-5 MCG/ML-% IV SOLN
INTRAVENOUS | Status: DC | PRN
  Administered 2016-11-02: 30 ug/min via INTRAVENOUS

## 2016-11-02 MED ORDER — BIVALIRUDIN BOLUS VIA INFUSION - CUPID
INTRAVENOUS | Status: DC | PRN
  Administered 2016-11-02: 96.3 mg via INTRAVENOUS

## 2016-11-02 MED ORDER — HEPARIN SODIUM (PORCINE) 1000 UNIT/ML IJ SOLN
INTRAMUSCULAR | Status: DC | PRN
  Administered 2016-11-02: 6000 [IU] via INTRAVENOUS

## 2016-11-02 MED ORDER — LABETALOL HCL 5 MG/ML IV SOLN
10.0000 mg | Freq: Once | INTRAVENOUS | Status: AC
  Administered 2016-11-02: 10 mg via INTRAVENOUS
  Filled 2016-11-02: qty 4

## 2016-11-02 MED ORDER — ONDANSETRON HCL 4 MG/2ML IJ SOLN: 4.0000 mg | Freq: Four times a day (QID) | INTRAMUSCULAR | Status: DC | PRN

## 2016-11-02 MED ORDER — ACETAMINOPHEN 325 MG PO TABS: 650.0000 mg | ORAL_TABLET | ORAL | Status: DC | PRN

## 2016-11-02 SURGICAL SUPPLY — 29 items
BALLN EMERGE MR 3.0X12 (BALLOONS) ×2
BALLN EUPHORA RX 2.5X12 (BALLOONS) ×2
BALLN ~~LOC~~ MOZEC 4.0X8 (BALLOONS) ×2
BALLOON EMERGE MR 3.0X12 (BALLOONS) ×1 IMPLANT
BALLOON EUPHORA RX 2.5X12 (BALLOONS) ×1 IMPLANT
BALLOON ~~LOC~~ MOZEC 4.0X8 (BALLOONS) ×1 IMPLANT
CATH EXPO 5FR ANG PIGTAIL 145 (CATHETERS) ×2 IMPLANT
CATH GUIDEZILLA II 6F (CATHETERS) ×1 IMPLANT
CATH HEARTRAIL 6F IL3.5 (CATHETERS) ×2 IMPLANT
CATH OPTITORQUE TIG 4.0 5F (CATHETERS) ×2 IMPLANT
CATH VISTA GUIDE 6FR XB3.5 (CATHETERS) IMPLANT
CATH VISTA GUIDE 6FR XB3.5 SH (CATHETERS) ×2 IMPLANT
CATH VISTA GUIDE 6FR XB4 (CATHETERS) ×2 IMPLANT
CATHETER GUIDEZILLA II 6F (CATHETERS) ×2
DEVICE RAD COMP TR BAND LRG (VASCULAR PRODUCTS) ×2 IMPLANT
GLIDESHEATH SLEND SS 6F .021 (SHEATH) ×2 IMPLANT
GUIDE CATH RUNWAY 6FR FL3.5 (CATHETERS) ×2 IMPLANT
GUIDE CATH RUNWAY 6FR VL3.5 (CATHETERS) ×2 IMPLANT
GUIDEWIRE INQWIRE 1.5J.035X260 (WIRE) ×1 IMPLANT
INQWIRE 1.5J .035X260CM (WIRE) ×2
KIT ENCORE 26 ADVANTAGE (KITS) ×2 IMPLANT
KIT HEART LEFT (KITS) ×2 IMPLANT
PACK CARDIAC CATHETERIZATION (CUSTOM PROCEDURE TRAY) ×2 IMPLANT
STENT XIENCE ALPINE RX 3.0X15 (Permanent Stent) ×2 IMPLANT
STENT XIENCE ALPINE RX 4.0X12 (Permanent Stent) ×2 IMPLANT
TRANSDUCER W/STOPCOCK (MISCELLANEOUS) ×2 IMPLANT
TUBING CIL FLEX 10 FLL-RA (TUBING) ×2 IMPLANT
WIRE COUGAR XT STRL 190CM (WIRE) ×2 IMPLANT
WIRE HI TORQ BMW 190CM (WIRE) ×2 IMPLANT

## 2016-11-02 NOTE — Progress Notes (Signed)
  Echocardiogram 2D Echocardiogram has been performed.  Lance Baker 11/02/2016, 12:13 PM

## 2016-11-02 NOTE — Progress Notes (Signed)
CRITICAL VALUE ALERT  Critical value received:  Troponin 0.54  Date of notification:  11/01/16  Time of notification:  0020  Critical value read back:Yes.    Nurse who received alert:  Jerolyn Center  MD notified (1st page):  Dr. Liane Comber  Time of first page:  0022  MD notified (2nd page):  Time of second page:  Responding MD:    Time MD responded:

## 2016-11-02 NOTE — Progress Notes (Signed)
Progress Note  Patient Name: Lance Baker Date of Encounter: 11/02/2016  Primary Cardiologist: Langston Masker, MD [facility]  Subjective   Obese gentleman with history of diabetes and prior history of multiple coronary stents. States he had a heart catheterization within the past 3 months that did not demonstrate any significant obstruction. First instance of cardiac disease was 2011. Last stent 2016. He is taken care of at the [facility] in [facility] by Dr. Langston Masker.  Inpatient Medications    Scheduled Meds: . amLODipine  2.5 mg Oral BID  . aspirin EC  81 mg Oral Daily  . atorvastatin  80 mg Oral QHS  . carvedilol  12.5 mg Oral BID WC  . clopidogrel  75 mg Oral Daily  . furosemide  40 mg Oral BID  . lisinopril  20 mg Oral BID  . pantoprazole  40 mg Oral BID  . potassium chloride  10 mEq Oral Daily  . senna-docusate  1 tablet Oral BID   Continuous Infusions: . heparin 1,500 Units/hr (11/02/16 0552)  . nitroGLYCERIN 23.333 mcg/min (11/02/16 0203)   PRN Meds: acetaminophen, bisacodyl, nitroGLYCERIN, ondansetron (ZOFRAN) IV, traMADol   Vital Signs    Vitals:   11/02/16 0600 11/02/16 0630 11/02/16 0700 11/02/16 0725  BP: (!) 165/101 (!) 179/86 (!) 179/99 (!) 179/99  Pulse: 81 63 74 68  Resp: (!) 22 17 15 17   Temp:    97.6 F (36.4 C)  TempSrc:    Oral  SpO2: 98% 96% 98% 99%  Weight:      Height:        Intake/Output Summary (Last 24 hours) at 11/02/16 1031 Last data filed at 11/02/16 0600  Gross per 24 hour  Intake           225.34 ml  Output              400 ml  Net          -174.66 ml   Filed Weights   11/01/16 1657 11/02/16 0400  Weight: 278 lb (126.1 kg) 283 lb (128.4 kg)    Telemetry    Normal sinus rhythm without significant ventricular ectopy - Personally Reviewed  ECG    11/02/16 at 7 AM: Normal sinus rhythm, left atrial abnormality, LVH with strain. No acute ST-T abnormality is noted. No evolution compared to prior tracing from  11/01/16. - Personally Reviewed  Physical Exam  Marked obesity. No complaints other than hunger. GEN: No acute distress.   Neck: No JVD Cardiac: RRR, no murmurs, rubs, or gallops. Excellent bilateral radial pulses. Respiratory: Clear to auscultation bilaterally. GI: Soft, nontender, non-distended  MS: No edema; No deformity. Neuro:  Nonfocal  Psych: Normal affect   Labs    Chemistry Recent Labs Lab 11/01/16 1739 11/02/16 0440  NA 138 138  K 3.9 3.5  CL 107 105  CO2 25 27  GLUCOSE 165* 114*  BUN 15 11  CREATININE 1.05 1.02  CALCIUM 9.1 8.6*  GFRNONAA >60 >60  GFRAA >60 >60  ANIONGAP 6 6     Hematology Recent Labs Lab 11/01/16 1739 11/02/16 0440  WBC 6.3 7.3  RBC 4.79 4.14*  HGB 12.7* 10.7*  HCT 37.7* 33.1*  MCV 78.7 80.0  MCH 26.5 25.8*  MCHC 33.7 32.3  RDW 15.6* 15.5  PLT 216 189    Cardiac Enzymes Recent Labs Lab 11/01/16 2328 11/02/16 0440  TROPONINI 0.54* 0.74*    Recent Labs Lab 11/01/16 1750  TROPIPOC 0.36*  BNPNo results for input(s): BNP, PROBNP in the last 168 hours.   DDimer No results for input(s): DDIMER in the last 168 hours.   Radiology    Dg Chest 2 View  Result Date: 11/01/2016 CLINICAL DATA:  Shortness of breath and cough for 2 weeks, diagnosed with the flu over a month ago, hypertension, diabetes mellitus, coronary artery disease post MI EXAM: CHEST  2 VIEW COMPARISON:  None FINDINGS: Upper normal heart size. Coronary stent noted. Mediastinal contours and pulmonary vascularity normal. Minimal scarring versus subsegmental atelectasis in lingula. Lungs otherwise clear. No pleural effusion or pneumothorax. Endplate spur formation thoracic spine. IMPRESSION: Post coronary stenting. Minimal atelectasis versus scarring in lingula without acute infiltrate. Electronically Signed   By: Ulyses Southward M.D.   On: 11/01/2016 17:44    Cardiac Studies   None available in our system.  Patient Profile     56 y.o. male with prior  myocardial infarction, multiple prior coronary stents including the LAD ("widow maker ", according to the patient), morbid obesity, depression, essential hypertension and hyperlipidemia. He denies tobacco use. Exertional chest discomfort over the past 6 months relieved with rest. On presentation chest discomfort persisting despite rest and nitroglycerin. Recent cardiac catheterization within the past 3 months did not reveal significant obstruction, according to the patient.  Assessment & Plan    1. Coronary artery disease with multiple prior coronary stents, location unknown. Currently pain-free. SCHEDULED for later today. Procedure and risks are discussed. He prefers radial approach. He is willing to proceed. We will attempt to get records from the [facility] in [facility] prior to the procedure. 2. Marked obesity 3. Diabetes mellitus type 2, without up-to-date A1c. 4. Presumed hyperlipidemia but no data available 5. Severe hypertension with poor current control. Will up titrate IV nitroglycerin and his typical oral medication regimen.  The patient was counseled to undergo left heart catheterization, coronary angiography, and possible percutaneous coronary intervention with stent implantation. The procedural risks and benefits were discussed in detail. The risks discussed included death, stroke, myocardial infarction, life-threatening bleeding, limb ischemia, kidney injury, allergy, and possible emergency cardiac surgery. The risk of these significant complications were estimated to occur less than 1% of the time. After discussion, the patient has agreed to proceed.    Signed, Lesleigh Noe, MD  11/02/2016, 10:31 AM

## 2016-11-02 NOTE — CV Procedure (Signed)
Cardiac catheterization/PCI note.  Patient called: Lance Baker Date 09/01/2016  Full procedure note to follow.  Cardiac catheterization and very difficult percutaneous coronary intervention.  Catheterization findings: Normal left main, patent stents in the proximal to mid LAD, mid LAD, 95% stenosis in a very small diagonal vessel and 90% apical LAD stenosis; left circumflex vessel with 30% proximal stenosis.  80% eccentric ulcerated stenosis just proximal to patent bifurcation stents in the mid AV groove circumflex and OM1 vessel, 95% distal left circumflex stenosis; pain stents in the mid RCA at the acute margin and in the PDA with 20 and 30% proximal RCA narrowings.  Normal LV function.  Very difficult but sucessful  PCI to the circumflex vessel with ultimate insertion of a 3.015 mm Xience DES stent in the distal circumflex dilated to 3.11 mm, and DES stenting of the 80% eccentric ulcerated stenosis with a 4.012 mm Xience stent postdilated to 4.14 mm.  The patient received Angiomax and Brilinta during the procedure.  Brilinta was discontinued upon leaving the catheterization laboratory.  He left the catheterization laboratory on IV nitroglycerin with plans to keep blood pressure greater than 110 and less than 140 systolically.  [name], M.D., FACC 9:47 PM  11/02/2016    

## 2016-11-02 NOTE — Care Management Note (Signed)
Case Management Note  Patient Details  Name: Lance Baker MRN: [id number] Date of Birth: Apr 25, 1961  Subjective/Objective:    Adm w nstemi                Action/Plan: lives w fam, on plavix   Expected Discharge Date:                  Expected Discharge Plan:  Home/Self Care  In-House Referral:     Discharge planning Services     Post Acute Care Choice:    Choice offered to:     DME Arranged:    DME Agency:     HH Arranged:    HH Agency:     Status of Service:  In process, will continue to follow  If discussed at Long Length of Stay Meetings, dates discussed:    Additional Comments: no dc needs anticipated  Hanley Hays, RN 11/02/2016, 9:04 AM

## 2016-11-02 NOTE — Progress Notes (Signed)
ANTICOAGULATION CONSULT NOTE - Follow Up Consult  Pharmacy Consult for heparin Indication: chest pain/ACS  Labs:  Recent Labs  11/01/16 1738 11/01/16 1739 11/01/16 2328 11/02/16 0440 11/02/16 1112  HGB  --  12.7*  --  10.7*  --   HCT  --  37.7*  --  33.1*  --   PLT  --  216  --  189  --   APTT 25  --  37*  --   --   LABPROT 13.2  --   --   --   --   INR 1.00  --   --   --   --   HEPARINUNFRC  --   --   --  0.23* 0.40  CREATININE  --  1.05  --  1.02  --   TROPONINI  --   --  0.54* 0.74*  --     Assessment: 56yo male with history of CAD (s/p multiple MI's and multiple stents) on heparin for CP . Heparin is at goal on 1500 units/hr and plans noted for cath today.  Goal of Therapy:  Heparin level 0.3-0.7 units/ml   Plan:  -No heparin changes needed -Will follow plans post cath  Harland German, Pharm D 11/02/2016 12:13 PM

## 2016-11-02 NOTE — Interval H&P Note (Signed)
Cath Lab Visit (complete for each Cath Lab visit)  Clinical Evaluation Leading to the Procedure:   ACS: Yes.    Non-ACS:    Anginal Classification: CCS III  Anti-ischemic medical therapy: Maximal Therapy (2 or more classes of medications)  Non-Invasive Test Results: No non-invasive testing performed  Prior CABG: No previous CABG      History and Physical Interval Note:  11/02/2016 6:21 PM  Lance Baker  has presented today for surgery, with the diagnosis of unstable angina  The various methods of treatment have been discussed with the patient and family. After consideration of risks, benefits and other options for treatment, the patient has consented to  Procedure(s): Left Heart Cath and Coronary Angiography (N/A) as a surgical intervention .  The patient's history has been reviewed, patient examined, no change in status, stable for surgery.  I have reviewed the patient's chart and labs.  Questions were answered to the patient's satisfaction.     Nicki Guadalajara

## 2016-11-02 NOTE — H&P (View-Only) (Signed)
Progress Note  Patient Name: Lance Baker Date of Encounter: 11/02/2016  Primary Cardiologist: Langston Masker, MD [facility]  Subjective   Obese gentleman with history of diabetes and prior history of multiple coronary stents. States he had a heart catheterization within the past 3 months that did not demonstrate any significant obstruction. First instance of cardiac disease was 2011. Last stent 2016. He is taken care of at the [facility] in [facility] by Dr. Langston Masker.  Inpatient Medications    Scheduled Meds: . amLODipine  2.5 mg Oral BID  . aspirin EC  81 mg Oral Daily  . atorvastatin  80 mg Oral QHS  . carvedilol  12.5 mg Oral BID WC  . clopidogrel  75 mg Oral Daily  . furosemide  40 mg Oral BID  . lisinopril  20 mg Oral BID  . pantoprazole  40 mg Oral BID  . potassium chloride  10 mEq Oral Daily  . senna-docusate  1 tablet Oral BID   Continuous Infusions: . heparin 1,500 Units/hr (11/02/16 0552)  . nitroGLYCERIN 23.333 mcg/min (11/02/16 0203)   PRN Meds: acetaminophen, bisacodyl, nitroGLYCERIN, ondansetron (ZOFRAN) IV, traMADol   Vital Signs    Vitals:   11/02/16 0600 11/02/16 0630 11/02/16 0700 11/02/16 0725  BP: (!) 165/101 (!) 179/86 (!) 179/99 (!) 179/99  Pulse: 81 63 74 68  Resp: (!) 22 17 15 17   Temp:    97.6 F (36.4 C)  TempSrc:    Oral  SpO2: 98% 96% 98% 99%  Weight:      Height:        Intake/Output Summary (Last 24 hours) at 11/02/16 1031 Last data filed at 11/02/16 0600  Gross per 24 hour  Intake           225.34 ml  Output              400 ml  Net          -174.66 ml   Filed Weights   11/01/16 1657 11/02/16 0400  Weight: 278 lb (126.1 kg) 283 lb (128.4 kg)    Telemetry    Normal sinus rhythm without significant ventricular ectopy - Personally Reviewed  ECG    11/02/16 at 7 AM: Normal sinus rhythm, left atrial abnormality, LVH with strain. No acute ST-T abnormality is noted. No evolution compared to prior tracing from  11/01/16. - Personally Reviewed  Physical Exam  Marked obesity. No complaints other than hunger. GEN: No acute distress.   Neck: No JVD Cardiac: RRR, no murmurs, rubs, or gallops. Excellent bilateral radial pulses. Respiratory: Clear to auscultation bilaterally. GI: Soft, nontender, non-distended  MS: No edema; No deformity. Neuro:  Nonfocal  Psych: Normal affect   Labs    Chemistry Recent Labs Lab 11/01/16 1739 11/02/16 0440  NA 138 138  K 3.9 3.5  CL 107 105  CO2 25 27  GLUCOSE 165* 114*  BUN 15 11  CREATININE 1.05 1.02  CALCIUM 9.1 8.6*  GFRNONAA >60 >60  GFRAA >60 >60  ANIONGAP 6 6     Hematology Recent Labs Lab 11/01/16 1739 11/02/16 0440  WBC 6.3 7.3  RBC 4.79 4.14*  HGB 12.7* 10.7*  HCT 37.7* 33.1*  MCV 78.7 80.0  MCH 26.5 25.8*  MCHC 33.7 32.3  RDW 15.6* 15.5  PLT 216 189    Cardiac Enzymes Recent Labs Lab 11/01/16 2328 11/02/16 0440  TROPONINI 0.54* 0.74*    Recent Labs Lab 11/01/16 1750  TROPIPOC 0.36*  BNPNo results for input(s): BNP, PROBNP in the last 168 hours.   DDimer No results for input(s): DDIMER in the last 168 hours.   Radiology    Dg Chest 2 View  Result Date: 11/01/2016 CLINICAL DATA:  Shortness of breath and cough for 2 weeks, diagnosed with the flu over a month ago, hypertension, diabetes mellitus, coronary artery disease post MI EXAM: CHEST  2 VIEW COMPARISON:  None FINDINGS: Upper normal heart size. Coronary stent noted. Mediastinal contours and pulmonary vascularity normal. Minimal scarring versus subsegmental atelectasis in lingula. Lungs otherwise clear. No pleural effusion or pneumothorax. Endplate spur formation thoracic spine. IMPRESSION: Post coronary stenting. Minimal atelectasis versus scarring in lingula without acute infiltrate. Electronically Signed   By: Ulyses Southward M.D.   On: 11/01/2016 17:44    Cardiac Studies   None available in our system.  Patient Profile     56 y.o. male with prior  myocardial infarction, multiple prior coronary stents including the LAD ("widow maker ", according to the patient), morbid obesity, depression, essential hypertension and hyperlipidemia. He denies tobacco use. Exertional chest discomfort over the past 6 months relieved with rest. On presentation chest discomfort persisting despite rest and nitroglycerin. Recent cardiac catheterization within the past 3 months did not reveal significant obstruction, according to the patient.  Assessment & Plan    1. Coronary artery disease with multiple prior coronary stents, location unknown. Currently pain-free. SCHEDULED for later today. Procedure and risks are discussed. He prefers radial approach. He is willing to proceed. We will attempt to get records from the [facility] in [facility] prior to the procedure. 2. Marked obesity 3. Diabetes mellitus type 2, without up-to-date A1c. 4. Presumed hyperlipidemia but no data available 5. Severe hypertension with poor current control. Will up titrate IV nitroglycerin and his typical oral medication regimen.  The patient was counseled to undergo left heart catheterization, coronary angiography, and possible percutaneous coronary intervention with stent implantation. The procedural risks and benefits were discussed in detail. The risks discussed included death, stroke, myocardial infarction, life-threatening bleeding, limb ischemia, kidney injury, allergy, and possible emergency cardiac surgery. The risk of these significant complications were estimated to occur less than 1% of the time. After discussion, the patient has agreed to proceed.    Signed, Lesleigh Noe, MD  11/02/2016, 10:31 AM

## 2016-11-02 NOTE — Progress Notes (Signed)
Cardiac catheterization/PCI note.  Patient called: Lance Baker Date 09/01/2016  Full procedure note to follow.  Cardiac catheterization and very difficult percutaneous coronary intervention.  Catheterization findings: Normal left main, patent stents in the proximal to mid LAD, mid LAD, 95% stenosis in a very small diagonal vessel and 90% apical LAD stenosis; left circumflex vessel with 30% proximal stenosis.  80% eccentric ulcerated stenosis just proximal to patent bifurcation stents in the mid AV groove circumflex and OM1 vessel, 95% distal left circumflex stenosis; pain stents in the mid RCA at the acute margin and in the PDA with 20 and 30% proximal RCA narrowings.  Normal LV function.  Very difficult but sucessful  PCI to the circumflex vessel with ultimate insertion of a 3.015 mm Xience DES stent in the distal circumflex dilated to 3.11 mm, and DES stenting of the 80% eccentric ulcerated stenosis with a 4.012 mm Xience stent postdilated to 4.14 mm.  The patient received Angiomax and Brilinta during the procedure.  Brilinta was discontinued upon leaving the catheterization laboratory.  He left the catheterization laboratory on IV nitroglycerin with plans to keep blood pressure greater than 110 and less than 140 systolically.  Lennette Bihari, M.D., [facility] 9:47 PM  11/02/2016

## 2016-11-02 NOTE — Progress Notes (Signed)
ANTICOAGULATION CONSULT NOTE - Follow Up Consult  Pharmacy Consult for heparin Indication: chest pain/ACS  Labs:  Recent Labs  11/01/16 1738 11/01/16 1739 11/01/16 2328 11/02/16 0440  HGB  --  12.7*  --  10.7*  HCT  --  37.7*  --  33.1*  PLT  --  216  --  189  APTT 25  --  37*  --   LABPROT 13.2  --   --   --   INR 1.00  --   --   --   HEPARINUNFRC  --   --   --  0.23*  CREATININE  --  1.05  --  1.02  TROPONINI  --   --  0.54* 0.74*    Assessment: 55yo male subtherapeutic on heparin with initial dosing for CP.  Goal of Therapy:  Heparin level 0.3-0.7 units/ml   Plan:  Will increase heparin gtt by 2 units/kgABW/hr to 1500 units/hr and check level with next lab draw.  Vernard Gambles, PharmD, BCPS  11/02/2016,5:52 AM

## 2016-11-03 DIAGNOSIS — I2510 Atherosclerotic heart disease of native coronary artery without angina pectoris: Secondary | ICD-10-CM

## 2016-11-03 DIAGNOSIS — G4733 Obstructive sleep apnea (adult) (pediatric): Secondary | ICD-10-CM

## 2016-11-03 LAB — BASIC METABOLIC PANEL
Anion gap: 8 (ref 5–15)
BUN: 9 mg/dL (ref 6–20)
CHLORIDE: 105 mmol/L (ref 101–111)
CO2: 25 mmol/L (ref 22–32)
Calcium: 8.5 mg/dL — ABNORMAL LOW (ref 8.9–10.3)
Creatinine, Ser: 1.01 mg/dL (ref 0.61–1.24)
GFR calc Af Amer: >60 mL/min (ref >60)
GFR calc non Af Amer: >60 mL/min (ref >60)
GLUCOSE: 140 mg/dL — AB (ref 65–99)
POTASSIUM: 3.8 mmol/L (ref 3.5–5.1)
SODIUM: 138 mmol/L (ref 135–145)

## 2016-11-03 LAB — LIPID PANEL
Cholesterol: 167 mg/dL (ref 0–200)
HDL: 30 mg/dL — ABNORMAL LOW (ref >40)
LDL Cholesterol: 111 mg/dL — ABNORMAL HIGH (ref 0–99)
Total CHOL/HDL Ratio: 5.6 RATIO
Triglycerides: 131 mg/dL (ref <150)
VLDL: 26 mg/dL (ref 0–40)

## 2016-11-03 LAB — HEPATIC FUNCTION PANEL
ALK PHOS: 85 U/L (ref 38–126)
ALT: 17 U/L (ref 17–63)
AST: 20 U/L (ref 15–41)
Albumin: 3.2 g/dL — ABNORMAL LOW (ref 3.5–5.0)
BILIRUBIN DIRECT: 0.2 mg/dL (ref 0.1–0.5)
BILIRUBIN INDIRECT: 0.7 mg/dL (ref 0.3–0.9)
Total Bilirubin: 0.9 mg/dL (ref 0.3–1.2)
Total Protein: 6 g/dL — ABNORMAL LOW (ref 6.5–8.1)

## 2016-11-03 LAB — CBC
HEMATOCRIT: 31.3 % — AB (ref 39.0–52.0)
HEMOGLOBIN: 10.2 g/dL — AB (ref 13.0–17.0)
MCH: 26 pg (ref 26.0–34.0)
MCHC: 32.6 g/dL (ref 30.0–36.0)
MCV: 79.6 fL (ref 78.0–100.0)
Platelets: 171 10*3/uL (ref 150–400)
RBC: 3.93 MIL/uL — ABNORMAL LOW (ref 4.22–5.81)
RDW: 15.6 % — AB (ref 11.5–15.5)
WBC: 7.5 10*3/uL (ref 4.0–10.5)

## 2016-11-03 LAB — POCT ACTIVATED CLOTTING TIME: Activated Clotting Time: 433 seconds

## 2016-11-03 LAB — TROPONIN I: Troponin I: 0.52 ng/mL (ref <0.03)

## 2016-11-03 MED ORDER — NITROGLYCERIN 0.4 MG SL SUBL: 0.4000 mg | SUBLINGUAL_TABLET | SUBLINGUAL | 3 refills | Status: AC | PRN

## 2016-11-03 MED ORDER — TICAGRELOR 90 MG PO TABS: 90.0000 mg | ORAL_TABLET | Freq: Two times a day (BID) | ORAL | 11 refills | Status: DC

## 2016-11-03 MED ORDER — LORAZEPAM 1 MG PO TABS
1.0000 mg | ORAL_TABLET | Freq: Once | ORAL | Status: AC
  Administered 2016-11-03: 1 mg via ORAL
  Filled 2016-11-03: qty 1

## 2016-11-03 MED ORDER — TICAGRELOR 90 MG PO TABS: 90.0000 mg | ORAL_TABLET | Freq: Two times a day (BID) | ORAL | 0 refills | Status: AC

## 2016-11-03 MED ORDER — AMLODIPINE BESYLATE 5 MG PO TABS: 5.0000 mg | ORAL_TABLET | Freq: Two times a day (BID) | ORAL | Status: DC

## 2016-11-03 MED ORDER — AMLODIPINE BESYLATE 5 MG PO TABS: 5.0000 mg | ORAL_TABLET | Freq: Two times a day (BID) | ORAL | 6 refills | Status: AC

## 2016-11-03 MED ORDER — NITROGLYCERIN 0.4 MG SL SUBL: 0.4000 mg | SUBLINGUAL_TABLET | SUBLINGUAL | 3 refills | Status: DC | PRN

## 2016-11-03 NOTE — Care Management Note (Signed)
Case Management Note  Patient Details  Name: Lance Baker MRN: [id number] Date of Birth: 07-31-1961  Subjective/Objective:         Adm w mi           Action/Plan: lives in [place], here for funeral. pcp is [facility]   Expected Discharge Date:                  Expected Discharge Plan:  Home/Self Care  In-House Referral:     Discharge planning Services  CM Consult, Medication Assistance  Post Acute Care Choice:    Choice offered to:     DME Arranged:    DME Agency:     HH Arranged:    HH Agency:     Status of Service:  In process, will continue to follow  If discussed at Long Length of Stay Meetings, dates discussed:    Additional Comments: gave pt 30day free brilinta card.  Hanley Hays, RN 11/03/2016, 10:49 AM

## 2016-11-03 NOTE — Progress Notes (Signed)
Discharged home accompanied by ex-wife and son, discharged instructions given to pt. Belongings taken home.

## 2016-11-03 NOTE — Progress Notes (Signed)
Pt very anxious and restless throughout the night. Has various complaints such as headaches, inability to fall asleep, being scared and nausea. PRN meds given to address these complaints. See MAR. Cardiology fellow on call notified of complaints and also elevated Bp.  He came to room to talk with pt . Pt states, " I'm scared". Ativan PO to be ordered. Will continue to monitor

## 2016-11-03 NOTE — Discharge Summary (Signed)
The patient has been seen in conjunction with Laverda Page, in NP-C. All aspects of care have been considered and discussed. The patient has been personally interviewed, examined, and all clinical data has been reviewed.   Digital images were reviewed. A stable-appearing result was obtained.   The patient is ambulated without difficulty and is able to be discharged today.  Please see my note from earlier today.      Discharge Summary    Patient ID: Lance Baker,  MRN: [id number], DOB/AGE: 1960-11-06 56 y.o.  Admit date: 11/01/2016 Discharge date: 11/03/2016  Primary Care Provider: No PCP Per Patient Primary Cardiologist: Dr. Langston Masker [facility])  Discharge Diagnoses    Principal Problem:   NSTEMI (non-ST elevated myocardial infarction) [facility]) Active Problems:   Morbid obesity (HCC)   Essential hypertension   Hyperlipidemia LDL goal <70   Type 2 diabetes mellitus with complications (HCC)   Obstructive sleep apnea   CAD in native artery   Allergies Allergies  Allergen Reactions  . Penicillins Hives and Other (See Comments)    Has patient had a PCN reaction causing immediate rash, facial/tongue/throat swelling, SOB or lightheadedness with hypotension: No Has patient had a PCN reaction causing severe rash involving mucus membranes or skin necrosis: No Has patient had a PCN reaction that required hospitalization No Has patient had a PCN reaction occurring within the last 10 years: No If all of the above answers are "NO", then may proceed with Cephalosporin use.    Diagnostic Studies/Procedures    TTE: 11/02/16  Study Conclusions  - Left ventricle: The cavity size was normal. There was severe   concentric hypertrophy. Systolic function was normal. The   estimated ejection fraction was in the range of 55% to 60%. Wall   motion was normal; there were no regional wall motion   abnormalities. There was an increased relative contribution of   atrial contraction to  ventricular filling. Doppler parameters are   consistent with abnormal left ventricular relaxation (grade 1   diastolic dysfunction). Doppler parameters are consistent with   high ventricular filling pressure. - Aortic valve: Trileaflet; normal thickness, mildly calcified   leaflets._____________   History of Present Illness     56 yo male with PMH of CAD s/p multiple stents. HTN, HL, DM who presented with NSTEMI.   Patient was visiting from out of town [facility]) due to a death in his family (his youngest daughter passed away). Lance Baker was the day prior to admission. On 2/12 he began experiencing left sided chest pain and pressure radiating into the neck and the left arm. Same pain that he has experienced in the past during his multiple prior MI's. Pain came on at rest and stuttered on and off for the remainder of the day and overnight. Patient's other daughter brought him to the hospital today for evaluation. He was chest pain free in the ED.  Patient noted that he has no history of volume retention or heart failure. He recalled having an echo sometime in the past few months with an LVEF of 60%. He had a coronary angiography in January 2018 (via radial approach) and does not believe any new stents were placed at that time. He takes aspirin and plavix every day. He denies any illicit drug use or alcohol use. He does not smoke cigarettes.   At OSH, patient was found to have an elevated troponin to 0.3. He was started on a nitro gtt and heparin gtt and transferred to [facility] for consideration of angiography.  He is followed by the [facility] in [place] by Dr. Langston Masker.   Hospital Course     Consultants: None  He was planned for LHC with Dr. Tresa Endo. Trop peaked at 0.74. Cardiac catheterization showed normal left main, patent stents in the proximal to mid LAD, mid LAD, 95% stenosis in a very small diagonal vessel and 90% apical LAD stenosis; left circumflex vessel with 30% proximal stenosis. 80%  eccentric ulcerated stenosis just proximal to patent bifurcation stents in the mid AV groove circumflex and OM1 vessel, 95% distal left circumflex stenosis; pain stents in the mid RCA at the acute margin and in the PDA with 20 and 30% proximal RCA narrowings.   He had a difficult but successful PCI to the circumflex with DES x1 to dLCx and DES to the 80% eccentric ulcerated stenosis. He received angiomax and Brilinta post procedure. High-grade obstruction in the apical LAD and mid to distal PDA not treated. Plan for medical therapy for concomitant CAD. Lipid panel showed HDL 30 and LDL 111. Hgb A1c was drawn with results pending.   He was able to ambulate in the hallway without residual chest pain. He was seen and assessed by Dr. Katrinka Blazing on 11/03/16 and determined stable for discharge home. Will print a Brilinta Rx for him prior to discharge. He will follow up with his cardiologist at home.  _____________  Discharge Vitals Blood pressure 137/79, pulse 81, temperature 98.4 F (36.9 C), temperature source Oral, resp. rate (!) 21, height 5' 9.5" (1.765 m), weight 281 lb 4.8 oz (127.6 kg), SpO2 99 %.  Filed Weights   11/01/16 1657 11/02/16 0400 11/03/16 0630  Weight: 278 lb (126.1 kg) 283 lb (128.4 kg) 281 lb 4.8 oz (127.6 kg)    Labs & Radiologic Studies    CBC  Recent Labs  11/02/16 0440 11/03/16 0013  WBC 7.3 7.5  HGB 10.7* 10.2*  HCT 33.1* 31.3*  MCV 80.0 79.6  PLT 189 171   Basic Metabolic Panel  Recent Labs  11/02/16 0440 11/03/16 0013  NA 138 138  K 3.5 3.8  CL 105 105  CO2 27 25  GLUCOSE 114* 140*  BUN 11 9  CREATININE 1.02 1.01  CALCIUM 8.6* 8.5*   Liver Function Tests  Recent Labs  11/03/16 0013  AST 20  ALT 17  ALKPHOS 85  BILITOT 0.9  PROT 6.0*  ALBUMIN 3.2*   No results for input(s): LIPASE, AMYLASE in the last 72 hours. Cardiac Enzymes  Recent Labs  11/02/16 0440 11/02/16 1112 11/03/16 0013  TROPONINI 0.74* 0.64* 0.52*   BNP Invalid input(s):  POCBNP D-Dimer No results for input(s): DDIMER in the last 72 hours. Hemoglobin A1C No results for input(s): HGBA1C in the last 72 hours. Fasting Lipid Panel  Recent Labs  11/03/16 1030  CHOL 167  HDL 30*  LDLCALC 111*  TRIG 131  CHOLHDL 5.6   Thyroid Function Tests No results for input(s): TSH, T4TOTAL, T3FREE, THYROIDAB in the last 72 hours.  Invalid input(s): FREET3 _____________  Dg Chest 2 View  Result Date: 11/01/2016 CLINICAL DATA:  Shortness of breath and cough for 2 weeks, diagnosed with the flu over a month ago, hypertension, diabetes mellitus, coronary artery disease post MI EXAM: CHEST  2 VIEW COMPARISON:  None FINDINGS: Upper normal heart size. Coronary stent noted. Mediastinal contours and pulmonary vascularity normal. Minimal scarring versus subsegmental atelectasis in lingula. Lungs otherwise clear. No pleural effusion or pneumothorax. Endplate spur formation thoracic spine. IMPRESSION: Post coronary stenting.  Minimal atelectasis versus scarring in lingula without acute infiltrate. Electronically Signed   By: Ulyses Southward M.D.   On: 11/01/2016 17:44   Disposition   Pt is being discharged home today in good condition.  Follow-up Plans & Appointments    Follow-up Information    Dr. Langston Masker Follow up.   Why:  Please arrange follow up with your MD at the [facility] with 7-10 days.          Discharge Instructions    Amb Referral to Cardiac Rehabilitation    Complete by:  As directed    To [place], [place] [facility]   Diagnosis:   Coronary Stents NSTEMI PTCA     Call MD for:  redness, tenderness, or signs of infection (pain, swelling, redness, odor or green/yellow discharge around incision site)    Complete by:  As directed    Diet - low sodium heart healthy    Complete by:  As directed    Discharge instructions    Complete by:  As directed    Radial Site Care Refer to this sheet in the next few weeks. These instructions provide you with information on caring for yourself  after your procedure. Your caregiver may also give you more specific instructions. Your treatment has been planned according to current medical practices, but problems sometimes occur. Call your caregiver if you have any problems or questions after your procedure. HOME CARE INSTRUCTIONS You may shower the day after the procedure.Remove the bandage (dressing) and gently wash the site with plain soap and water.Gently pat the site dry.  Do not apply powder or lotion to the site.  Do not submerge the affected site in water for 3 to 5 days.  Inspect the site at least twice daily.  Do not flex or bend the affected arm for 24 hours.  No lifting over 5 pounds (2.3 kg) for 5 days after your procedure.  Do not drive home if you are discharged the same day of the procedure. Have someone else drive you.  You may drive 24 hours after the procedure unless otherwise instructed by your caregiver.  What to expect: Any bruising will usually fade within 1 to 2 weeks.  Blood that collects in the tissue (hematoma) may be painful to the touch. It should usually decrease in size and tenderness within 1 to 2 weeks.  SEEK IMMEDIATE MEDICAL CARE IF: You have unusual pain at the radial site.  You have redness, warmth, swelling, or pain at the radial site.  You have drainage (other than a small amount of blood on the dressing).  You have chills.  You have a fever or persistent symptoms for more than 72 hours.  You have a fever and your symptoms suddenly get worse.  Your arm becomes pale, cool, tingly, or numb.  You have heavy bleeding from the site. Hold pressure on the site.   Increase activity slowly    Complete by:  As directed       Discharge Medications   Discharge Medication List as of 11/03/2016 12:35 PM    CONTINUE these medications which have CHANGED   Details  amLODipine (NORVASC) 5 MG tablet Take 1 tablet (5 mg total) by mouth 2 (two) times daily., Starting Wed 11/03/2016, Print    nitroGLYCERIN  (NITROSTAT) 0.4 MG SL tablet Place 1 tablet (0.4 mg total) under the tongue every 5 (five) minutes x 3 doses as needed for chest pain., Starting Wed 11/03/2016, Print    ticagrelor (BRILINTA) 90 MG  TABS tablet Take 1 tablet (90 mg total) by mouth 2 (two) times daily., Starting Wed 11/03/2016, Print      CONTINUE these medications which have NOT CHANGED   Details  aspirin 81 MG chewable tablet Chew 81 mg by mouth daily., Historical Med    atorvastatin (LIPITOR) 80 MG tablet Take 80 mg by mouth at bedtime., Historical Med    bisacodyl (DULCOLAX) 5 MG EC tablet Take 10 mg by mouth 2 (two) times daily as needed for moderate constipation., Historical Med    carvedilol (COREG) 25 MG tablet Take 12.5 mg by mouth 2 (two) times daily with a meal., Historical Med    furosemide (LASIX) 40 MG tablet Take 40 mg by mouth 2 (two) times daily., Historical Med    lisinopril (PRINIVIL,ZESTRIL) 20 MG tablet Take 20 mg by mouth 2 (two) times daily., Historical Med    Magnesium Oxide 420 MG TABS Take 840 mg by mouth daily., Historical Med    metFORMIN (GLUCOPHAGE-XR) 500 MG 24 hr tablet Take 500 mg by mouth daily with breakfast., Historical Med    pantoprazole (PROTONIX) 40 MG tablet Take 40 mg by mouth 2 (two) times daily., Historical Med    potassium chloride (K-DUR,KLOR-CON) 10 MEQ tablet Take 10 mEq by mouth daily., Historical Med    senna-docusate (SENOKOT-S) 8.6-50 MG tablet Take 1 tablet by mouth 2 (two) times daily., Historical Med    traMADol (ULTRAM) 50 MG tablet Take 50 mg by mouth 3 (three) times daily as needed for moderate pain., Historical Med      STOP taking these medications     clopidogrel (PLAVIX) 75 MG tablet          Aspirin prescribed at discharge?  Yes High Intensity Statin Prescribed? (Lipitor 40-80mg  or Crestor 20-40mg ): Yes Beta Blocker Prescribed? Yes For EF <40%, was ACEI/ARB Prescribed? Yes ADP Receptor Inhibitor Prescribed? (i.e. Plavix etc.-Includes Medically  Managed Patients): Yes For EF <40%, Aldosterone Inhibitor Prescribed? No: EF ok Was EF assessed during THIS hospitalization? Yes Was Cardiac Rehab II ordered? (Included Medically managed Patients): Yes   Outstanding Labs/Studies   None  Duration of Discharge Encounter   Greater than 30 minutes including physician time.  Signed, Laverda Page NP-C 11/03/2016, 2:23 PM

## 2016-11-03 NOTE — Progress Notes (Signed)
CARDIAC REHAB PHASE I   PRE:  Rate/Rhythm: 91 SR    BP: sitting 132/71    SaO2:   MODE:  Ambulation: 420 ft   POST:  Rate/Rhythm: 102 ST    BP: sitting 150/83     SaO2:   Pt is mostly blind. Needed assist to navigate hall. No CV c/o. Slow pace. He sts he tries to exercise back home and is a member of [employer]. He also tries to cook healthy. He struggles to balance depending on people. He lives with his brother. MI/stent ed completed. Will refer to [facility] in [place], [place]. Encouraged f/u with his cardiologist. Discussed getting 30 days of Brilinta and NTG here. He is planning to get on bus tomorrow to go home. He has been staying with his ex wife since January when his 8 year old daughter passed away. He has significant stress for multiple reasons.  [id number]   Harriet Masson CES, ACSM 11/03/2016 11:28 AM

## 2016-11-03 NOTE — Progress Notes (Signed)
Progress Note  Patient Name: Lance Baker Date of Encounter: 11/03/2016  Primary Cardiologist: Langston Masker, MD [facility]  Subjective   Obtained cath report from [facility]. Multiple prior LAD and circumflex stents. At catheterization yesterday noted to have high-grade obstruction in the mid circumflex above a previously stented bifurcation and also severe at distal circumflex disease. Both areas were successfully stented and a complicated and prolonged procedure. High-grade obstruction in the apical LAD and mid to distal PDA not treated. No ischemic symptoms this morning. No significant enzyme bump. Kidney function is stable.  Inpatient Medications    Scheduled Meds: . amLODipine  2.5 mg Oral BID  . aspirin EC  81 mg Oral Daily  . atorvastatin  80 mg Oral QHS  . carvedilol  12.5 mg Oral BID WC  . furosemide  40 mg Oral BID  . lisinopril  20 mg Oral BID  . pantoprazole  40 mg Oral BID  . potassium chloride  10 mEq Oral Daily  . senna-docusate  1 tablet Oral BID  . sodium chloride flush  3 mL Intravenous Q12H  . ticagrelor  90 mg Oral BID   Continuous Infusions: . sodium chloride 125 mL/hr at 11/03/16 1000  . nitroGLYCERIN 40 mcg/min (11/03/16 1000)   PRN Meds: sodium chloride, acetaminophen, bisacodyl, diazepam, nitroGLYCERIN, ondansetron (ZOFRAN) IV, sodium chloride flush, traMADol   Vital Signs    Vitals:   11/03/16 0330 11/03/16 0400 11/03/16 0630 11/03/16 0750  BP: (!) 164/84 (!) 160/103  138/74  Pulse: 64 64  79  Resp: 13 12  20   Temp:    98 F (36.7 C)  TempSrc:    Oral  SpO2: 98% 95%  97%  Weight:   281 lb 4.8 oz (127.6 kg)   Height:        Intake/Output Summary (Last 24 hours) at 11/03/16 1013 Last data filed at 11/03/16 1000  Gross per 24 hour  Intake          1946.25 ml  Output             1925 ml  Net            21.25 ml   Filed Weights   11/01/16 1657 11/02/16 0400 11/03/16 0630  Weight: 278 lb (126.1 kg) 283 lb (128.4 kg) 281 lb 4.8 oz  (127.6 kg)    Telemetry    Normal sinus rhythm without significant ventricular ectopy - Personally Reviewed  ECG    11/02/16 at 7 AM: Normal sinus rhythm, left atrial abnormality, LVH with strain. No acute ST-T abnormality is noted. No evolution compared to prior tracing from 11/01/16. - Personally Reviewed  Physical Exam  Marked obesity.  Difficult to arouse from sleep. Loud snoring and intermittent upper airway obstruction noted on observation. GEN: No acute distress.   Neck: No JVD Cardiac: RRR, no murmurs, rubs, or gallops. Excellent bilateral radial pulses.Right radial cath site is unremarkable. Respiratory: Clear to auscultation bilaterally. GI: Soft, nontender, non-distended  MS: No edema; No deformity. Neuro:  Nonfocal  Psych: Normal affect   Labs    Chemistry  Recent Labs Lab 11/01/16 1739 11/02/16 0440 11/03/16 0013  NA 138 138 138  K 3.9 3.5 3.8  CL 107 105 105  CO2 25 27 25   GLUCOSE 165* 114* 140*  BUN 15 11 9   CREATININE 1.05 1.02 1.01  CALCIUM 9.1 8.6* 8.5*  PROT  --   --  6.0*  ALBUMIN  --   --  3.2*  AST  --   --  20  ALT  --   --  17  ALKPHOS  --   --  85  BILITOT  --   --  0.9  GFRNONAA >60 >60 >60  GFRAA >60 >60 >60  ANIONGAP 6 6 8      Hematology  Recent Labs Lab 11/01/16 1739 11/02/16 0440 11/03/16 0013  WBC 6.3 7.3 7.5  RBC 4.79 4.14* 3.93*  HGB 12.7* 10.7* 10.2*  HCT 37.7* 33.1* 31.3*  MCV 78.7 80.0 79.6  MCH 26.5 25.8* 26.0  MCHC 33.7 32.3 32.6  RDW 15.6* 15.5 15.6*  PLT 216 189 171    Cardiac Enzymes  Recent Labs Lab 11/01/16 2328 11/02/16 0440 11/02/16 1112 11/03/16 0013  TROPONINI 0.54* 0.74* 0.64* 0.52*     Recent Labs Lab 11/01/16 1750  TROPIPOC 0.36*     BNPNo results for input(s): BNP, PROBNP in the last 168 hours.   DDimer No results for input(s): DDIMER in the last 168 hours.   Radiology    Dg Chest 2 View  Result Date: 11/01/2016 CLINICAL DATA:  Shortness of breath and cough for 2 weeks,  diagnosed with the flu over a month ago, hypertension, diabetes mellitus, coronary artery disease post MI EXAM: CHEST  2 VIEW COMPARISON:  None FINDINGS: Upper normal heart size. Coronary stent noted. Mediastinal contours and pulmonary vascularity normal. Minimal scarring versus subsegmental atelectasis in lingula. Lungs otherwise clear. No pleural effusion or pneumothorax. Endplate spur formation thoracic spine. IMPRESSION: Post coronary stenting. Minimal atelectasis versus scarring in lingula without acute infiltrate. Electronically Signed   By: Ulyses Southward M.D.   On: 11/01/2016 17:44    Cardiac Studies   None available in our system.  Patient Profile     57 y.o. male with prior myocardial infarction, multiple prior coronary stents including the LAD ("widow-maker ", according to the patient), morbid obesity, depression, essential hypertension and hyperlipidemia. He denies tobacco use. Exertional chest discomfort over the past 6 months relieved with rest. On presentation chest discomfort persisting despite rest and nitroglycerin. Recent cardiac catheterization within the past 3 months did not reveal significant obstruction, according to the patient. Underwent complex circumflex mid and distal stenting 11/03/2016 without complications.  Assessment & Plan    1. Coronary artery disease with Non-ST elevation MI and multiple prior coronary stents,who underwent successful complicated circumflex mid and distal intervention last evening by Dr. Tresa Endo with a beautiful final result. Left untreated were high-grade stenoses in the distal PDA, LAD, and diffusely diseased diagonal branches. These latter areas will require medication therapy only. 2. Morbid obesity with probable obstructive sleep apnea observed this morning on exam. 3. Diabetes mellitus type 2, without up-to-date A1c. Pending 4. Presumed hyperlipidemia but no data available. Lipid panel is pending today. 5. Severe hypertension with poor current  control. Will ambulate with cardiac rehabilitation, further adjust antihypertensive regimen, and anticipate discharge later today.    If the patient ambulates without difficulty will plan discharge today. He will need to be on aspirin and Brilinta (and eventuallyPlavix) indefinitely.   Signed, Lesleigh Noe, MD  11/03/2016, 10:13 AM

## 2016-11-04 ENCOUNTER — Encounter (HOSPITAL_COMMUNITY): Payer: Self-pay | Admitting: Cardiovascular Disease

## 2016-11-04 LAB — HEMOGLOBIN A1C
Hgb A1c MFr Bld: 6.7 % — ABNORMAL HIGH (ref 4.8–5.6)
Mean Plasma Glucose: 146 mg/dL

## 2018-09-09 IMAGING — CR DG CHEST 2V
2 series · 2 of 2 positions shown · non-contrast
Comparison: None

CLINICAL DATA: Shortness of breath and cough for 2 weeks, diagnosed
with the flu over a month ago, hypertension, diabetes mellitus,
coronary artery disease post MI

EXAM:
CHEST  2 VIEW

[w chest pa]
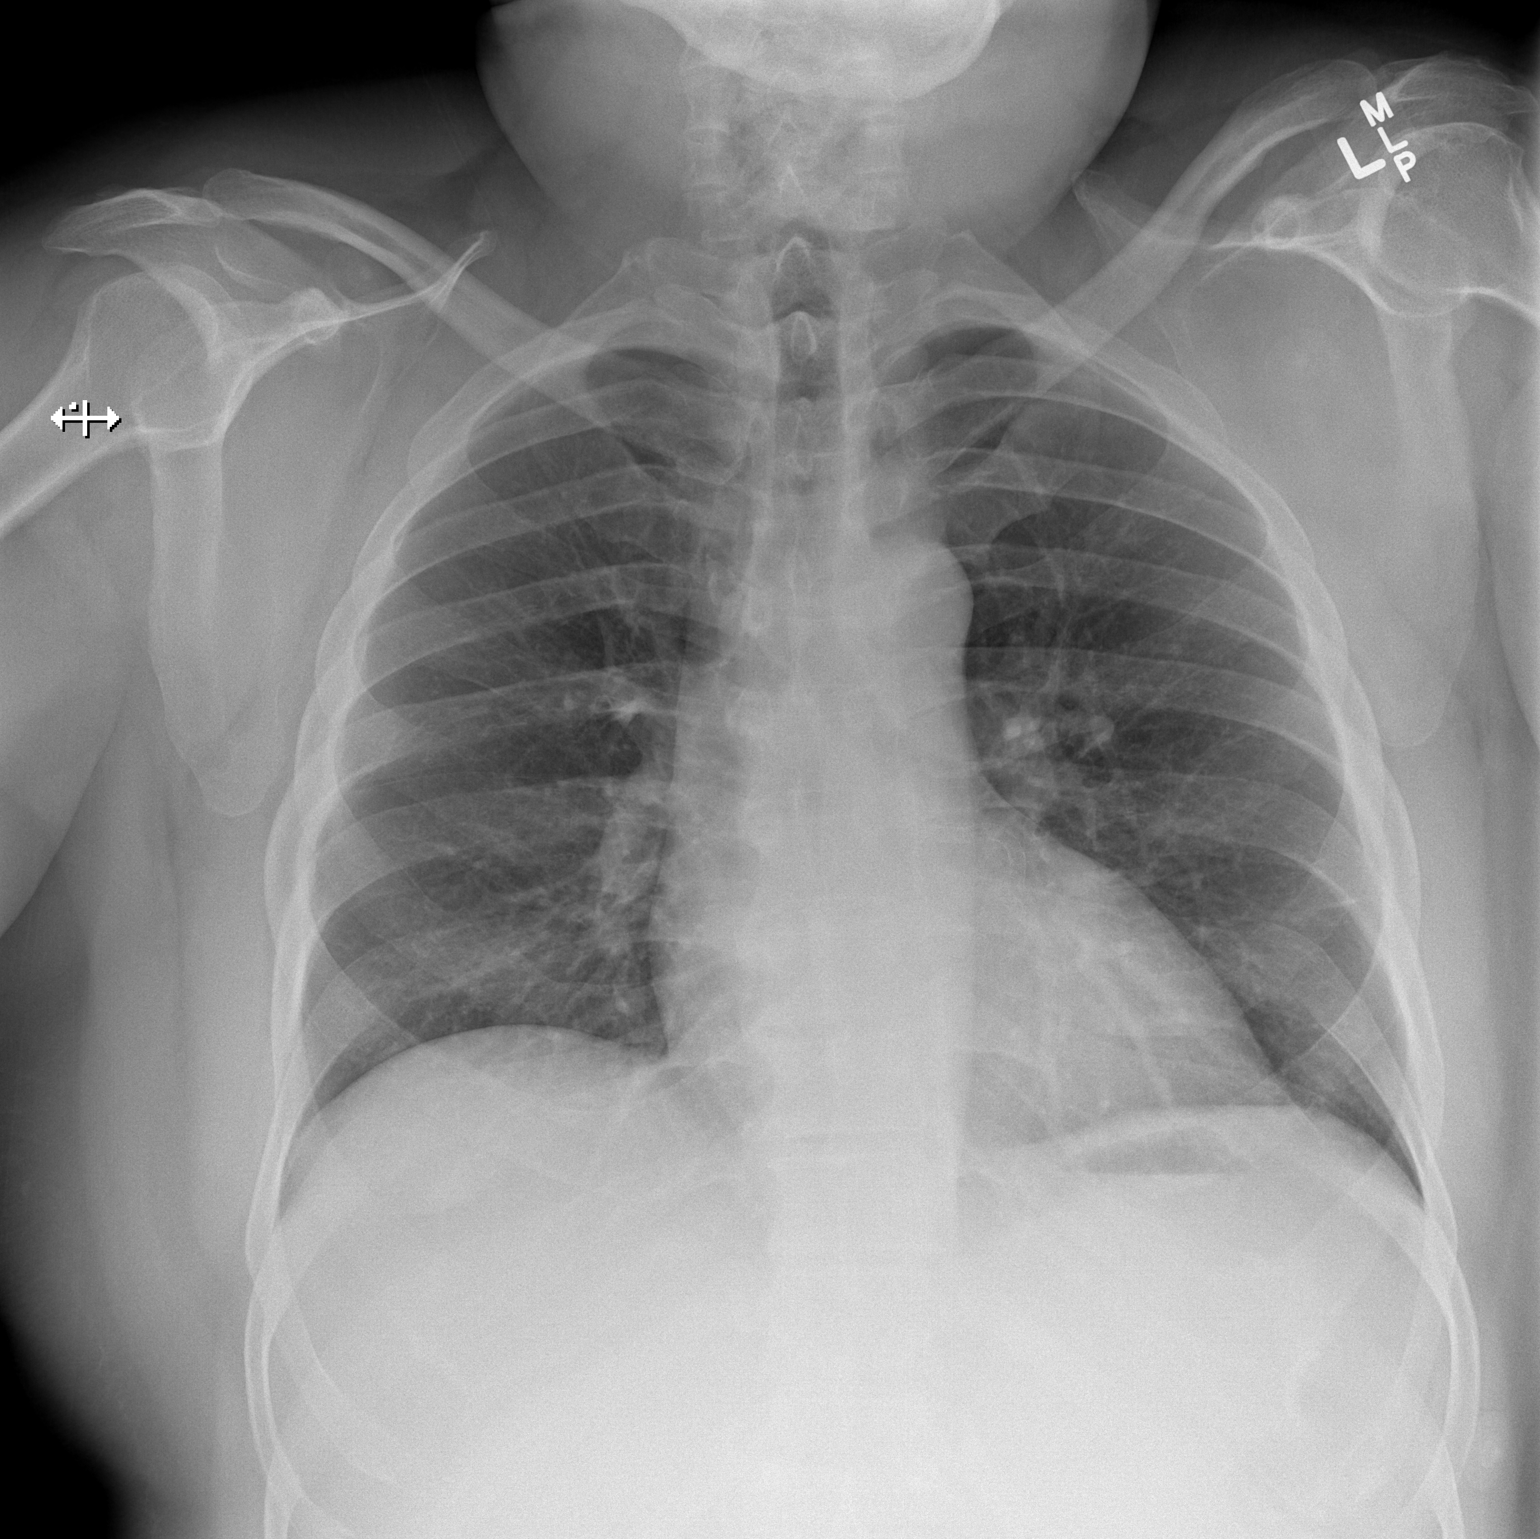

[w chest lat]
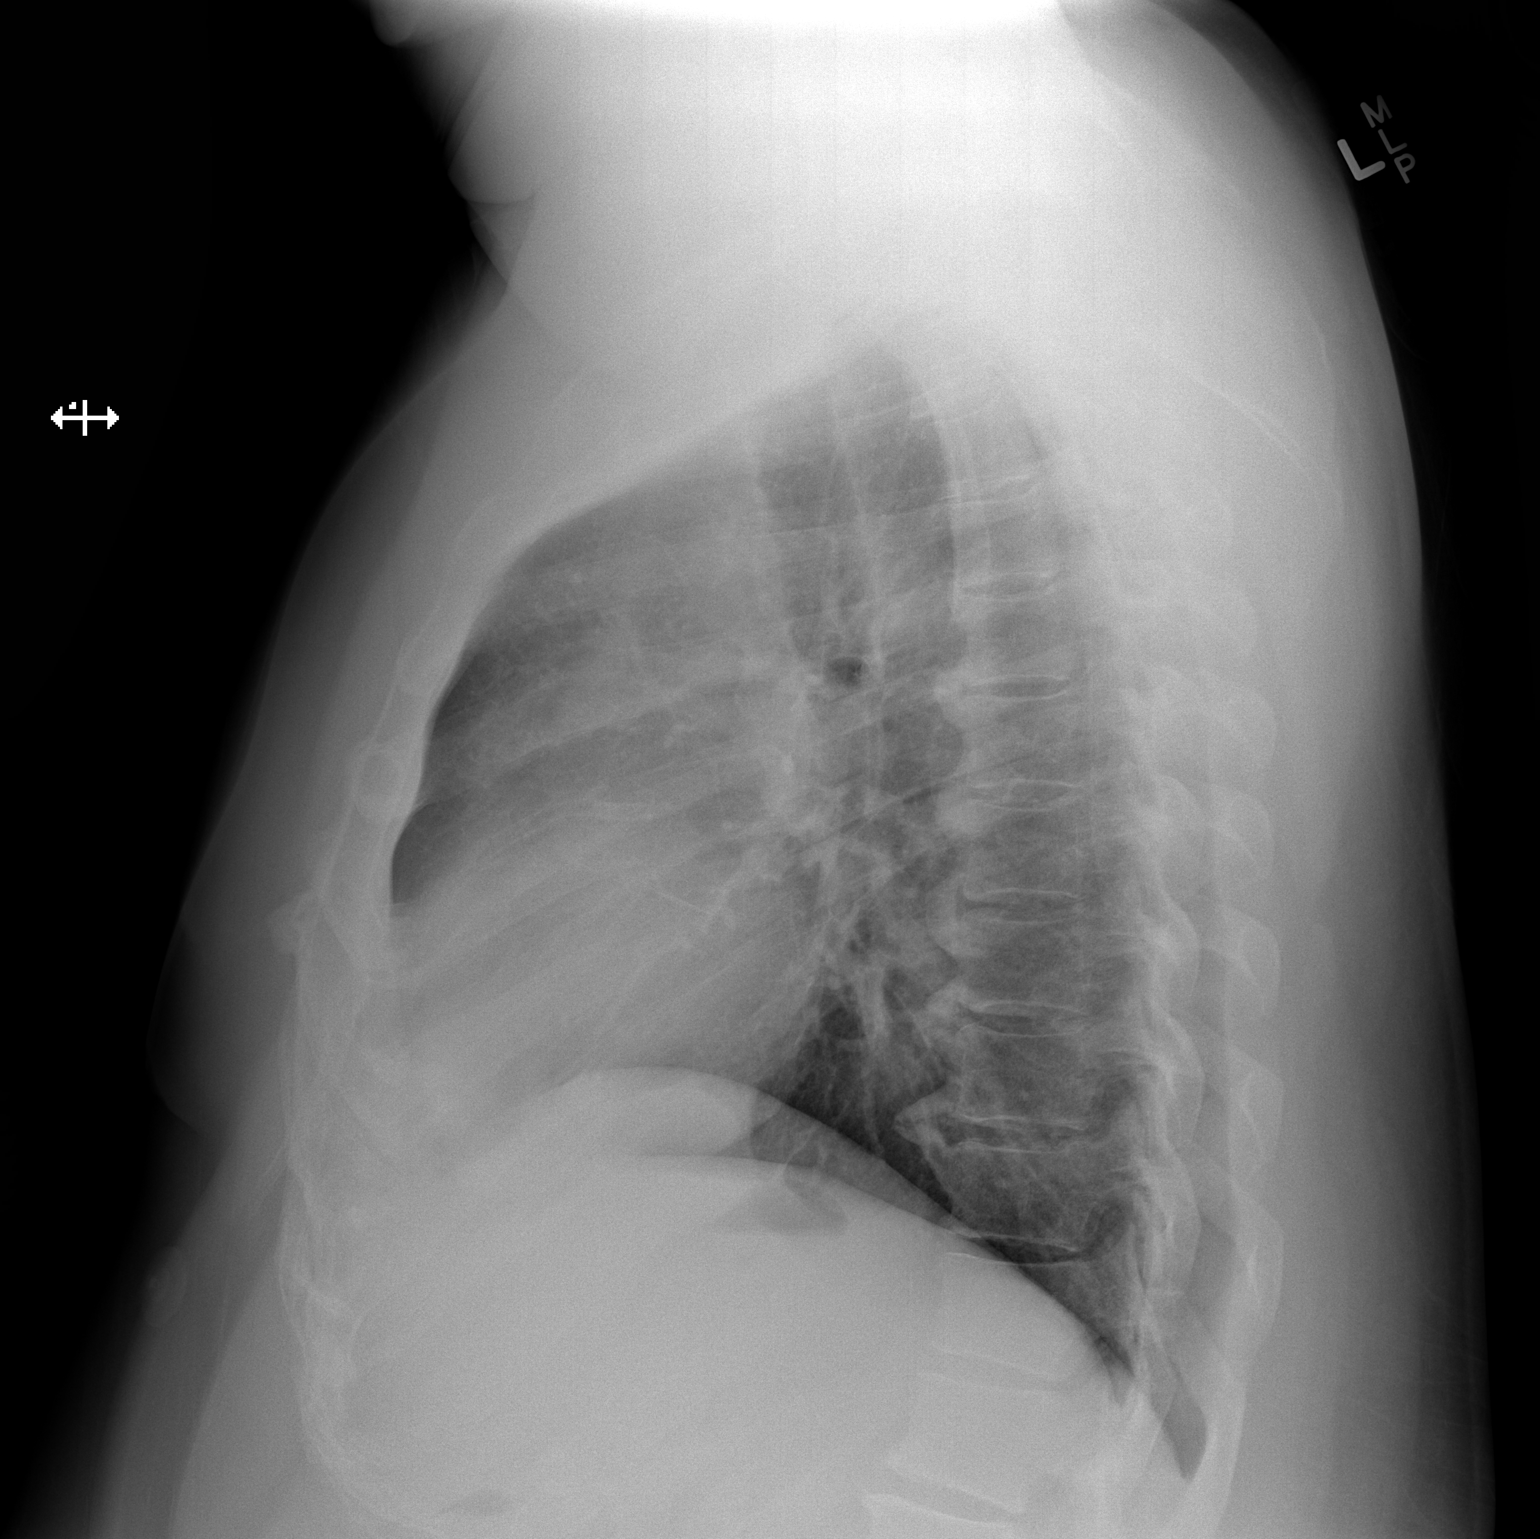

[2 of 2 positions shown; findings below may reference images not displayed]

FINDINGS: Upper normal heart size.

Coronary stent noted.

Mediastinal contours and pulmonary vascularity normal.

Minimal scarring versus subsegmental atelectasis in lingula.

Lungs otherwise clear.

No pleural effusion or pneumothorax.

Endplate spur formation thoracic spine.
IMPRESSION: Post coronary stenting.

Minimal atelectasis versus scarring in lingula without acute
infiltrate.

## 2023-08-23 ENCOUNTER — Encounter (HOSPITAL_COMMUNITY): Payer: Self-pay

## 2023-08-23 ENCOUNTER — Ambulatory Visit (HOSPITAL_COMMUNITY)
Admission: EM | Admit: 2023-08-23 | Discharge: 2023-08-23 | Disposition: A | Discharge status: Home / Self Care | Payer: Medicare PPO

## 2023-08-23 DIAGNOSIS — J028 Acute pharyngitis due to other specified organisms: Secondary | ICD-10-CM | POA: Diagnosis not present

## 2023-08-23 DIAGNOSIS — Z760 Encounter for issue of repeat prescription: Secondary | ICD-10-CM

## 2023-08-23 MED ORDER — BENZONATATE 100 MG PO CAPS: 100.0000 mg | ORAL_CAPSULE | Freq: Three times a day (TID) | ORAL | 0 refills | Status: AC

## 2023-08-23 MED ORDER — INSULIN GLARGINE-YFGN 100 UNIT/ML ~~LOC~~ SOPN: 40.0000 [IU] | PEN_INJECTOR | Freq: Every day | SUBCUTANEOUS | 0 refills | Status: AC

## 2023-08-23 MED ORDER — AZITHROMYCIN 250 MG PO TABS: ORAL_TABLET | ORAL | 0 refills | Status: AC

## 2023-08-23 NOTE — Discharge Instructions (Addendum)
Acute pharyngitis with persistent symptoms for the last 2 weeks. Will treat with the following:  Azithromycin 250mg  take 2 tablets today, then 1 daily for 4 days. Benzonatate 100mg  every 8 hours as needed for cough Zyrtec daily until returning home to help control symptoms Tylenol or ibuprofen for fever/pain Refill called in for Long acting insulin, insulin glargine Rest and hydrate Return to urgent care or PCP if symptoms worsen or fail to resolve.

## 2023-08-23 NOTE — ED Provider Notes (Signed)
[facility]    CSN: [id number] Arrival date & time: 08/23/23  1522      History   Chief Complaint Chief Complaint  Patient presents with   Cough    HPI Lance Baker is a 62 y.o. male.   62 yr male who presents to urgent care with complaints of cough and congestion with night sweats and some headaches that occur when coughing hard. Visiting here from [facility] visit his daughter who just had a baby. Started feeling sick right after arriving here on the 15th.  The cough and congestion have gotten worse.  He has been trying over-the-counter decongestants along with Tylenol but he feels he is getting worse.  He also relates that he needs a refill of his long-acting insulin.   Cough Associated symptoms: headaches and sore throat   Associated symptoms: no chest pain, no chills, no ear pain, no fever, no rash and no shortness of breath     Past Medical History:  Diagnosis Date   Anxiety    Coronary artery disease    Depression    Diabetes mellitus without complication (HCC)    Glaucoma    Hypertension    Myocardial infarct [facility])     Patient Active Problem List   Diagnosis Date Noted   Obstructive sleep apnea 11/03/2016   CAD in native artery 11/03/2016   Morbid obesity (HCC) 11/02/2016   Essential hypertension 11/02/2016   Hyperlipidemia LDL goal <70 11/02/2016   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   NSTEMI (non-ST elevated myocardial infarction) (HCC) 11/01/2016    Past Surgical History:  Procedure Laterality Date   cardiac stents     CORONARY STENT INTERVENTION N/A 11/02/2016   Procedure: Coronary Stent Intervention;  Surgeon: Lennette Bihari, MD;  Location: [facility];  Service: Cardiovascular;  Laterality: N/A;   EYE SURGERY     LEFT HEART CATH AND CORONARY ANGIOGRAPHY N/A 11/02/2016   Procedure: Left Heart Cath and Coronary Angiography;  Surgeon: Lennette Bihari, MD;  Location: [facility];  Service: Cardiovascular;   Laterality: N/A;       Home Medications    Prior to Admission medications   Medication Sig Start Date End Date Taking? Authorizing Provider  insulin glargine-yfgn (SEMGLEE) 100 UNIT/ML Pen Inject into the skin 2 (two) times daily. 10/08/22  Yes [provider]  amLODipine (NORVASC) 5 MG tablet Take 1 tablet (5 mg total) by mouth 2 (two) times daily. 11/03/16   Arty Baumgartner, NP  aspirin 81 MG chewable tablet Chew 81 mg by mouth daily.    [provider]  atorvastatin (LIPITOR) 80 MG tablet Take 80 mg by mouth at bedtime.    [provider]  bisacodyl (DULCOLAX) 5 MG EC tablet Take 10 mg by mouth 2 (two) times daily as needed for moderate constipation.    [provider]  carvedilol (COREG) 25 MG tablet Take 12.5 mg by mouth 2 (two) times daily with a meal.    [provider]  furosemide (LASIX) 40 MG tablet Take 40 mg by mouth 2 (two) times daily.    [provider]  lisinopril (PRINIVIL,ZESTRIL) 20 MG tablet Take 20 mg by mouth 2 (two) times daily.    [provider]  Magnesium Oxide 420 MG TABS Take 840 mg by mouth daily.    [provider]  metFORMIN (GLUCOPHAGE-XR) 500 MG 24 hr tablet Take 500 mg by mouth daily with breakfast.  [provider]  nitroGLYCERIN (NITROSTAT) 0.4 MG SL tablet Place 1 tablet (0.4 mg total) under the tongue every 5 (five) minutes x 3 doses as needed for chest pain. 11/03/16   Strader, Lennart Pall, PA-C  pantoprazole (PROTONIX) 40 MG tablet Take 40 mg by mouth 2 (two) times daily.    [provider]  potassium chloride (K-DUR,KLOR-CON) 10 MEQ tablet Take 10 mEq by mouth daily.    [provider]  senna-docusate (SENOKOT-S) 8.6-50 MG tablet Take 1 tablet by mouth 2 (two) times daily.    [provider]  ticagrelor (BRILINTA) 90 MG TABS tablet Take 1 tablet (90 mg total) by mouth 2 (two) times daily. 11/03/16   Strader, Lennart Pall, PA-C  traMADol (ULTRAM)  50 MG tablet Take 50 mg by mouth 3 (three) times daily as needed for moderate pain.    [provider]    Family History History reviewed. No pertinent family history.  Social History Social History   Tobacco Use   Smoking status: Never   Smokeless tobacco: Never  Substance Use Topics   Alcohol use: No   Drug use: No     Allergies   Penicillins   Review of Systems Review of Systems  Constitutional:  Negative for chills and fever.  HENT:  Positive for congestion and sore throat. Negative for ear pain.   Eyes:  Negative for pain and visual disturbance.  Respiratory:  Positive for cough. Negative for shortness of breath.   Cardiovascular:  Negative for chest pain and palpitations.  Gastrointestinal:  Negative for abdominal pain and vomiting.  Genitourinary:  Negative for dysuria and hematuria.  Musculoskeletal:  Negative for arthralgias and back pain.  Skin:  Negative for color change and rash.  Neurological:  Positive for headaches. Negative for seizures and syncope.  All other systems reviewed and are negative.    Physical Exam Triage Vital Signs ED Triage Vitals  Encounter Vitals Group     BP 08/23/23 1654 (!) 143/79     Systolic BP Percentile --      Diastolic BP Percentile --      Pulse Rate 08/23/23 1654 71     Resp 08/23/23 1654 16     Temp 08/23/23 1654 98.1 F (36.7 C)     Temp Source 08/23/23 1654 Oral     SpO2 08/23/23 1654 100 %     Weight 08/23/23 1654 275 lb (124.7 kg)     Height 08/23/23 1654 5\' 10"  (1.778 m)     Head Circumference --      Peak Flow --      Pain Score 08/23/23 1653 0     Pain Loc --      Pain Education --      Exclude from Growth Chart --    No data found.  Updated Vital Signs BP (!) 143/79 (BP Location: Right Arm)   Pulse 71   Temp 98.1 F (36.7 C) (Oral)   Resp 16   Ht 5\' 10"  (1.778 m)   Wt 275 lb (124.7 kg)   SpO2 100%   BMI 39.46 kg/m   Visual Acuity Right Eye Distance:   Left Eye Distance:    Bilateral Distance:    Right Eye Near:   Left Eye Near:    Bilateral Near:     Physical Exam Vitals and nursing note reviewed.  Constitutional:      General: He is not in acute distress.    Appearance: He is well-developed.  HENT:     Head: Normocephalic and atraumatic.     Right Ear: Tympanic membrane normal.     Left Ear: Tympanic membrane normal.     Nose: Congestion present.     Mouth/Throat:     Mouth: Mucous membranes are moist.     Pharynx: Posterior oropharyngeal erythema (very mild) present.  Eyes:     Conjunctiva/sclera: Conjunctivae normal.  Cardiovascular:     Rate and Rhythm: Normal rate and regular rhythm.     Heart sounds: No murmur heard. Pulmonary:     Effort: Pulmonary effort is normal. No respiratory distress.     Breath sounds: Normal breath sounds.  Abdominal:     Palpations: Abdomen is soft.     Tenderness: There is no abdominal tenderness.  Musculoskeletal:        General: No swelling.     Cervical back: Neck supple.  Skin:    General: Skin is warm and dry.     Capillary Refill: Capillary refill takes less than 2 seconds.  Neurological:     Mental Status: He is alert.  Psychiatric:        Mood and Affect: Mood normal.      UC Treatments / Results  Labs (all labs ordered are listed, but only abnormal results are displayed) Labs Reviewed - No data to display  EKG   Radiology No results found.  Procedures Procedures (including critical care time)  Medications Ordered in UC Medications - No data to display  Initial Impression / Assessment and Plan / UC Course  I have reviewed the triage vital signs and the nursing notes.  Pertinent labs & imaging results that were available during my care of the patient were reviewed by me and considered in my medical decision making (see chart for details).     Acute pharyngitis due to other specified organisms  Encounter for medication refill   Acute pharyngitis with persistent symptoms  for the last 2 weeks. Will treat with the following:  Azithromycin 250mg  take 2 tablets today, then 1 daily for 4 days. Benzonatate 100mg  every 8 hours as needed for cough Zyrtec daily until returning home to help control symptoms Tylenol or ibuprofen for fever/pain Refill called in for Long acting insulin, insulin glargine Rest and hydrate Return to urgent care or PCP if symptoms worsen or fail to resolve.    Final Clinical Impressions(s) / UC Diagnoses   Final diagnoses:  None   Discharge Instructions   None    ED Prescriptions   None    PDMP not reviewed this encounter.   Landis Martins, [place] 08/23/23 1727

## 2023-08-23 NOTE — ED Triage Notes (Signed)
Patient here today with c/o cough and congestion X 2 weeks. Patient states that he has also had night sweats. He has tried taking a decongestant with some relief.  Patient also needs a refill on his long acting insulin because the airport took it from him. He is from out of town.
